# Patient Record
Sex: Female | Born: 1975 | Race: White | Hispanic: No | Marital: Married | Smoking: Never smoker
Health system: Southern US, Community
[De-identification: ages and names within clinical notes are randomized; demographics above are authoritative.]

## PROBLEM LIST (undated history)

## (undated) DIAGNOSIS — Z9289 Personal history of other medical treatment: Secondary | ICD-10-CM

## (undated) DIAGNOSIS — Z8619 Personal history of other infectious and parasitic diseases: Secondary | ICD-10-CM

## (undated) DIAGNOSIS — D638 Anemia in other chronic diseases classified elsewhere: Secondary | ICD-10-CM

## (undated) DIAGNOSIS — K5190 Ulcerative colitis, unspecified, without complications: Secondary | ICD-10-CM

## (undated) DIAGNOSIS — K51911 Ulcerative colitis, unspecified with rectal bleeding: Secondary | ICD-10-CM

## (undated) HISTORY — PX: OTHER SURGICAL HISTORY: SHX169

---

## 1996-12-20 HISTORY — PX: WISDOM TOOTH EXTRACTION: SHX21

## 2007-10-23 ENCOUNTER — Inpatient Hospital Stay (HOSPITAL_COMMUNITY): Admission: AD | Admit: 2007-10-23 | Discharge: 2007-10-23 | Payer: Self-pay | Admitting: Obstetrics and Gynecology

## 2007-10-24 ENCOUNTER — Inpatient Hospital Stay (HOSPITAL_COMMUNITY): Admission: AD | Admit: 2007-10-24 | Discharge: 2007-10-31 | Payer: Self-pay | Admitting: Obstetrics and Gynecology

## 2007-10-27 ENCOUNTER — Encounter (INDEPENDENT_AMBULATORY_CARE_PROVIDER_SITE_OTHER): Payer: Self-pay | Admitting: Gastroenterology

## 2007-10-30 ENCOUNTER — Ambulatory Visit: Payer: Self-pay | Admitting: Internal Medicine

## 2008-06-05 ENCOUNTER — Inpatient Hospital Stay (HOSPITAL_COMMUNITY): Admission: AD | Admit: 2008-06-05 | Discharge: 2008-06-08 | Payer: Self-pay | Admitting: Obstetrics and Gynecology

## 2008-08-15 ENCOUNTER — Ambulatory Visit (HOSPITAL_COMMUNITY): Admission: RE | Admit: 2008-08-15 | Discharge: 2008-08-15 | Payer: Self-pay | Admitting: Obstetrics and Gynecology

## 2008-12-20 HISTORY — PX: OTHER SURGICAL HISTORY: SHX169

## 2009-07-23 ENCOUNTER — Encounter (INDEPENDENT_AMBULATORY_CARE_PROVIDER_SITE_OTHER): Payer: Self-pay | Admitting: Otolaryngology

## 2009-07-23 ENCOUNTER — Ambulatory Visit (HOSPITAL_BASED_OUTPATIENT_CLINIC_OR_DEPARTMENT_OTHER): Admission: RE | Admit: 2009-07-23 | Discharge: 2009-07-23 | Payer: Self-pay | Admitting: Otolaryngology

## 2011-03-27 LAB — POCT HEMOGLOBIN-HEMACUE: Hemoglobin: 15.5 g/dL — ABNORMAL HIGH (ref 12.0–15.0)

## 2011-05-04 NOTE — Op Note (Signed)
Morgan [name], Morgan [name]                 ACCOUNT NO.:  [id number]   MEDICAL RECORD NO.:  [id number]          PATIENT TYPE:  AMB   LOCATION:  DSC                          FACILITY:  [facility]   PHYSICIAN:  Jefry H. 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   PROCEDURE:  Sistrunk procedure.   ANESTHESIA:  General endotracheal anesthesia was used.   COMPLICATIONS:  No complications.   BLOOD LOSS:  No blood loss.   SURGEON:  Jefry H. Pollyann Kennedy, MD   FINDINGS:  A superficial cystic mass at the midline of neck between the  level of the hyoid and thyroid cartilage with thinning of the skin  overlying filled with a mucoid material with extension down to the  midportion of the hyoid bone, but nothing beyond that.   HISTORY:  A 35 year old with a history of a small cyst in the anterior  neck that recently ruptured and drained mucoid material.  Risks,  benefits, alternatives, and complications of the procedure explained to  the patient, seemed to understand, and agreed to surgery.   DESCRIPTION OF PROCEDURE:  The patient was taken to the operating room  and placed on the operating table in the supine position.  Following  induction of general endotracheal anesthesia, the neck was prepped and  draped in standard fashion.  An ellipse of skin was outlined in a  transverse fashion overlying the cystic mass, and a 15 scalpel was used  to incise the skin.  Electrocautery was used to continue the dissection  through the subcutaneous tissue and the superficial fascia.  A small  skin flap was elevated superiorly and inferiorly and an Alm retractor  was used to keep the skin flaps open.  The cystic structure was  dissected from surrounding  tissue above the thyroid notch and down to  the level of the hyoid bone.  The midportion of the hyoid between the  lesser cornu bilaterally was transected using a bone cutter.  The  muscular attachments of the hyoid were transected with cautery and  inspection revealed no evidence of any extension of this beyond the  hyoid.  A core of suprahyoid musculature was taken approximately 1.5 cm  beyond the hyoid and taken with the specimen.  The specimen was sent for  pathologic evaluation.  The wound was irrigated with saline, and cautery  was used for completion of hemostasis.  Rubber band drain was left in  the  wound and exited through the central part of the incision.  The deep  tissue was reapproximated with 4-0 chromic suture and the subcuticular  layer was closed similarly.  A running 5-0 nylon was used on the skin.  A dressing was applied, and the patient was then awakened, extubated,  and transferred to recovery in stable condition.       Jefry  Massie Kluver, MD  Electronically Signed     JHR/MEDQ  D:  07/23/2009  T:  07/23/2009  Job:  [id number]

## 2011-05-04 NOTE — H&P (Signed)
NAMEMIKAYA, BUNNER NO.:  [id number]   MEDICAL RECORD NO.:  [id number]          PATIENT TYPE:  INP   LOCATION:  9167                          [facility]ACILITY:  [facility]   PHYSICIAN:  Lenoard Aden, M.D.DATE OF BIRTH:  [birth year]   DATE OF ADMISSION:  06/05/2008  DATE OF DISCHARGE:                              HISTORY & PHYSICAL   CHIEF COMPLAINT:  LGA, polyhydramnios.   She is a 35 year old female, G2, P1, apparently at 71 weeks' gestation  who presents for induction due to the aforementioned indications.   ALLERGIES:  ERYTHROMYCIN CAUSES AN UPSET STOMACH.   MEDICATIONS:  Include prenatal vitamins and sulfasalazine.   History of new onset C. diff colitis, questionable Crohn's disease,  hospitalized for 1 week during this pregnancy with GI followup.  History  of breast cysts.  History of hyperemesis.   FAMILY HISTORY:  COPD, hypertension, diabetes, kidney stones, colon  cancer and depression, OCD.   She has a personal history of a 41-week intrauterine pregnancy,  delivered, 9 pounds 9 ounces with mild shoulder dystocia.   PHYSICAL EXAMINATION:  GENERAL:  On physical exam she is a well-  developed, well-nourished female in no acute distress.   HEENT:  Normal.   LUNGS:  Clear.   HEART:  Regular rhythm.   ABDOMEN:  Soft, gravid, nontender.  Estimated fetal weight 8-1/2 to 9  pounds.   PELVIC:  Cervix is 2 cm, 50% vertex, -1, -2.   EXTREMITIES:  Negative.   NEUROLOGIC:  Nonfocal.   SKIN:  Intact.   Cervidil to be placed.  NST is reactive.   IMPRESSION:  1. A 39-week intrauterine pregnancy.  2. Large for gestational age with polyhydramnios.  3. History of large for gestational age with moderate shoulder      dystocia.   PLAN:  Proceed with cervical ripening.  Anticipate attempts at vaginal  delivery with Pitocin in a.m. and epidural as needed.      Lenoard Aden, M.D.  Electronically Signed     RJT/MEDQ  D:  06/05/2008  T:  06/05/2008   Job:  [id number]

## 2011-05-04 NOTE — H&P (Signed)
[name], Morgan [name] NO.:  [id number]   MEDICAL RECORD NO.:  [id number]          PATIENT TYPE:  AMB   LOCATION:  SDC                           [facility]   PHYSICIAN:  Lenoard Aden, M.D.DATE OF BIRTH:  1976-05-22   DATE OF ADMISSION:  08/15/2008  DATE OF DISCHARGE:                              HISTORY & PHYSICAL   CHIEF COMPLAINT:  Vaginal stenosis, scarring, and dyspareunia.   HISTORY OF PRESENT ILLNESS:  She is a 35 year old female G2, P2, with  history of vaginal scarring after her first delivery status post second  vaginal delivery with persistent vaginal stenosis status post  uncomplicated repair, and vulvodynia secondary dyspareunia and vulvar  pain before vaginoplasty, vulvar adhesions, and removal of scar tissue  today.   ALLERGIES:  She has allergies to ERYTHROMYCIN.   MEDICATIONS:  Prenatal vitamins and Micronor.   FAMILY HISTORY:  Hypertension, COPD, diabetes, kidney stones, colon  cancer, depression, OCD, previous pregnancy course uncomplicated.   SOCIAL HISTORY:  Otherwise, noncontributory.   PHYSICAL EXAMINATION:  GENERAL:  She is a well-developed, well-nourished  white female in no acute distress.  HEENT:  Normal.  LUNGS:  Clear.  HEART:  Regular rhythm.  ABDOMEN:  Soft and nontender.  PELVIC:  Reveals posterior fourchette scarring and stenosis of the  vaginal opening at the posterior aspect, which admits only one finger  entry and it is noncoital in its capacity.  EXTREMITIES:  There are no cords.  NEUROLOGIC:  Intact.   IMPRESSION:  Post-delivery vaginal scarring and stenosis.   PLAN:  Proceed with revision of vaginal scar tissue, vaginoplasty, and  perineorrhaphy.  Risks of anesthesia, infection, bleeding, injury to  abdominal organs, and need for further surgery were discussed.  The  patient acknowledges and wishes to proceed.     Lenoard Aden, M.D.  Electronically Signed    RJT/MEDQ  D:  08/15/2008  T:   08/16/2008  Job:  [id number]

## 2011-05-04 NOTE — Consult Note (Signed)
Morgan [name], Morgan [name] NO.:  [id number]   MEDICAL RECORD NO.:  [id number]          PATIENT TYPE:  MAT   LOCATION:  MATC                          [facility]ILITY[facility]   PHYSICIAN:  Lenoard Aden, M.D.DATE OF BIRTH:  1976-05-09   DATE OF CONSULTATION:  DATE OF DISCHARGE:  10/23/2007                                 CONSULTATION   CHIEF COMPLAINT:  Nausea, vomiting, diarrhea.   She is a 35 year old female G2, P1, at [redacted] weeks gestational age who  presents with nausea, vomiting and now diarrhea for 3 weeks.  She has no  known drug allergies.   MEDICATIONS:  Zofran and prenatal vitamins as tolerated.   FAMILY HISTORY:  Diabetes, hypertension, otherwise noncontributory.   SOCIAL HISTORY:  Nonsmoker, nondrinker, denies domestic physical  violence, otherwise noncontributory.   History of uncomplicated vaginal delivery x1.   PHYSICAL EXAMINATION:  VITAL SIGNS:  Blood pressure 114/68, weight of  155 pounds.  Labs are consistent with a urine which is noted to be  positive for small ketones.  HEENT:  Normal.  LUNGS:  Clear.  HEART:  Regular rhythm.  ABDOMEN:  Soft, nontender, no rebound, no guarding, no CVA tenderness.  EXTREMITIES:  There are no cords.  NEUROLOGICAL:  Exam is nonfocal.  PELVIC:  Deferred.  SKIN:  Intact.   IMPRESSION:  A 7-week obstetrical patient with nausea, vomiting,  hyperemesis and possible viral gastroenteritis.   PLAN:  We will proceed with IV fluid hydration, Zofran IV, supportive  therapy.  Continue Imodium.  Stool for O and P, blood and leukocytes.  We will follow up and probably discharge, status post IV fluid  hydration.      Lenoard Aden, M.D.  Electronically Signed     RJT/MEDQ  D:  10/23/2007  T:  10/24/2007  Job:  [id number]

## 2011-05-04 NOTE — Op Note (Signed)
NAMEKATARZYNA, Morgan [name]                 ACCOUNT NO.:  [id number]   MEDICAL RECORD NO.:  [id number]          PATIENT TYPE:  AMB   LOCATION:  SDC                           [facility]   PHYSICIAN:  Lenoard Aden, M.D.DATE OF BIRTH:  03-Aug-1976   DATE OF PROCEDURE:  08/15/2008  DATE OF DISCHARGE:                               OPERATIVE REPORT   PREOPERATIVE DIAGNOSES:  Vaginal stenosis, dyspareunia, perineal  scarring, status post vaginal delivery, and vulvodynia.   POSTOPERATIVE DIAGNOSES:  Vaginal stenosis, dyspareunia, perineal  scarring, status post vaginal delivery, and vulvodynia.   PROCEDURES:  Repair of vaginal stenosis, vaginoplasty, and  perineorrhaphy.   SURGEON:  Lenoard Aden, MD.   ANESTHESIA:  Spinal and local.   ESTIMATED BLOOD LOSS:  Less than 50 mL.   COMPLICATIONS:  None.   DRAINS:  None.   COUNTS:  Correct.   The patient to recovery in good condition.   DESCRIPTION OF PROCEDURE:  After being apprised of the risks of  anesthesia, infection, bleeding, possible injury from bowel and bladder,  need for repair, and inability to cure perineal pain, the patient was  brought to the operating room where she was administered a spinal  anesthetic without complications.  She was prepped and draped in the  usual sterile fashion and catheterized until the bladder was empty.  Visualization reveals significant vaginal stenosis and scarring, status  post vaginal delivery with the vagina unable to admit one finger with a  large amount of posterior coaptation of the vaginal mucosa.  This area,  which was thickened, was incised in a vertical fashion using a scalpel  down into the perineal end of the posterior fourchette and the vagina  was undermined anteriorly using sharp dissection with Metzenbaum  scissors.  Good hemostasis was achieved using electrocautery on a  subcutaneous tissue.  Multiple subcutaneous sutures were placed to  approximate dead space using a 3-0  Vicryl suture on an SH needle and at  this time, mattress sutures were placed closing the vagina front to  back.  Multiple interrupted sutures were placed.  Good hemostasis was  achieved and multiple interrupted sutures were placed once again to  reapproximate  the skin approximating the perineorrhaphy repair in a standard fashion  using the 3-0 Vicryl.  Good hemostasis was noted.  A dilute Marcaine  solution 10 mL was placed at the termination of the case.  The patient  was awakened and transferred to recovery in good condition.      Lenoard Aden, M.D.  Electronically Signed     RJT/MEDQ  D:  08/15/2008  T:  08/16/2008  Job:  [id number]

## 2011-05-04 NOTE — H&P (Signed)
NAMEHal [name] NO.:  [id number]   MEDICAL RECORD NO.:  [id number]         PATIENT TYPE:  INP   LOCATION:                                [facility]   PHYSICIAN:  Lenoard Aden, M.D.DATE OF BIRTH:  [birth year]   DATE OF ADMISSION:  10/24/2007  DATE OF DISCHARGE:                              HISTORY & PHYSICAL   HOSPITAL COURSE:  Abdominal pain and bloody diarrhea.   She is a 35 year old female G2, P1, [redacted] weeks gestation with persistent 3-  week history of bloody diarrhea, intermittent nausea and vomiting.  She  is seen by GI today, Dr. Loreta Ave who has made the decision to proceed with  further workup in the hospital and henceforth, the patient was admitted  today.   ALLERGIES:  She has allergy to ERYTHROMYCIN.   MEDICATIONS:  1. Zofran.  2. Prenatal vitamins.   FAMILY HISTORY:  Diabetes, hypertension.   SOCIAL HISTORY:  Non-smoker, non-drinker.   __________ History of 1 uncomplicated vaginal delivery.   PHYSICAL EXAMINATION:  VITAL SIGNS:  Blood pressure 114/68, weight 155  lb.  HEENT:  Normal.  LUNGS:  Clear.  HEART:  Regular rhythm.  ABDOMEN:  Soft.  No rebound, no guarding.  Good bowel sounds noted.  PELVIC:  __________, 7-8 weeks size uterus with no adnexal masses.  EXTREMITIES:  __________.  NEUROLOGIC:  Nonfocal.  SKIN:  Intact.   IMPRESSION:  1. A 7-week OB.  2. Persistent nausea and vomiting, diarrhea.  Questionable viral      gastritis versus other causes of infectious diarrhea.   PLAN:  Will follow with GI, IV fluids, Zofran, antibiotics as needed.  Anticipate inpatient hospitalization, diagnostic testing.  Probable  discharge, possibly, 48-72 hours.      Lenoard Aden, M.D.  Electronically Signed     RJT/MEDQ  D:  10/24/2007  T:  10/25/2007  Job:  [id number]

## 2011-05-07 NOTE — Discharge Summary (Signed)
NAMEGENTRY, SEEBER                 ACCOUNT NO.:  [id number]   MEDICAL RECORD NO.:  [id number]          PATIENT TYPE:  INP   LOCATION:  9302                          [facility]   PHYSICIAN:  Lenoard Aden, M.D.DATE OF BIRTH:  1976/08/04   DATE OF ADMISSION:  10/24/2007  DATE OF DISCHARGE:  10/31/2007                               DISCHARGE SUMMARY   The patient was admitted for persistent diarrhea and abdominal pain with  secondary dehydration and first trimester pregnancy.  She had a  longstanding history of diarrhea, the etiology was uncertain.  She was  therefore admitted for IV fluid, antibiotics and stool specimen follow-  up.  She is followed by GI during her hospitalization and remained  stable.  She had multiple cultures done, serial labs and C Dif toxin  which came back positive.  She was treated appropriately with  antibiotics.  She was transported to [facility] on October 27, 2007 for  a colonoscopy which also was unrevealing.  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  She will follow-up with  GI within 1 week.  Follow up in the OB/GYN office within 1 week.  Discharge teaching was done.      Lenoard Aden, M.D.  Electronically Signed     RJT/MEDQ  D:  11/29/2007  T:  11/29/2007  Job:  [id number]

## 2011-09-16 LAB — CBC
HCT: 34.6 — ABNORMAL LOW
HCT: 35.2 — ABNORMAL LOW
Hemoglobin: 12.2
Hemoglobin: 12.3
MCHC: 35.3
MCV: 91.7
RDW: 13.2
WBC: 8.3

## 2011-09-28 LAB — BASIC METABOLIC PANEL
BUN: 1 — ABNORMAL LOW
BUN: 2 — ABNORMAL LOW
BUN: <1 — ABNORMAL LOW
BUN: <1 — ABNORMAL LOW
BUN: <1 — ABNORMAL LOW
CO2: 25
CO2: 28
Calcium: 7.7 — ABNORMAL LOW
Calcium: 7.8 — ABNORMAL LOW
Calcium: 7.8 — ABNORMAL LOW
Chloride: 100
Chloride: 105
Chloride: 95 — ABNORMAL LOW
Creatinine, Ser: 0.45
Creatinine, Ser: 0.53
Creatinine, Ser: 0.58
GFR calc Af Amer: >60
GFR calc non Af Amer: >60
Glucose, Bld: 104 — ABNORMAL HIGH
Glucose, Bld: 110 — ABNORMAL HIGH
Glucose, Bld: 114 — ABNORMAL HIGH
Potassium: 3.4 — ABNORMAL LOW
Potassium: 3.9
Sodium: 130 — ABNORMAL LOW
Sodium: 134 — ABNORMAL LOW

## 2011-09-28 LAB — CLOSTRIDIUM DIFFICILE EIA
C difficile Toxins A+B, EIA: NEGATIVE
C difficile Toxins A+B, EIA: NEGATIVE

## 2011-09-28 LAB — CBC
HCT: 32.4 — ABNORMAL LOW
HCT: 33.9 — ABNORMAL LOW
HCT: 35.3 — ABNORMAL LOW
Hemoglobin: 11.4 — ABNORMAL LOW
MCHC: 34.2
MCV: 85.8
MCV: 85.9
MCV: 86.4
MCV: 86.4
Platelets: 495 — ABNORMAL HIGH
RBC: 3.82 — ABNORMAL LOW
RDW: 12.9
WBC: 8
WBC: 8.1
WBC: 8.1

## 2011-09-28 LAB — HEPATIC FUNCTION PANEL
ALT: 18
Albumin: 2.2 — ABNORMAL LOW
Alkaline Phosphatase: 75
Bilirubin, Direct: 0.2
Indirect Bilirubin: 0.8
Total Bilirubin: 1

## 2011-09-28 LAB — URINALYSIS, ROUTINE W REFLEX MICROSCOPIC
Bilirubin Urine: NEGATIVE
Nitrite: NEGATIVE
Urobilinogen, UA: 0.2
pH: 7.5

## 2011-09-28 LAB — STOOL CULTURE

## 2011-09-28 LAB — DIFFERENTIAL
Basophils Absolute: 0
Basophils Relative: 0
Neutrophils Relative %: 50

## 2011-09-28 LAB — OVA AND PARASITE EXAMINATION

## 2012-10-01 ENCOUNTER — Emergency Department (HOSPITAL_COMMUNITY): Payer: Commercial Indemnity

## 2012-10-01 ENCOUNTER — Encounter (HOSPITAL_COMMUNITY): Payer: Self-pay | Admitting: *Deleted

## 2012-10-01 ENCOUNTER — Emergency Department (HOSPITAL_COMMUNITY)
Admission: EM | Admit: 2012-10-01 | Discharge: 2012-10-01 | Disposition: A | Discharge status: Home Health | Payer: Commercial Indemnity | Attending: Emergency Medicine | Admitting: Emergency Medicine

## 2012-10-01 DIAGNOSIS — T887XXA Unspecified adverse effect of drug or medicament, initial encounter: Secondary | ICD-10-CM

## 2012-10-01 DIAGNOSIS — R519 Headache, unspecified: Secondary | ICD-10-CM

## 2012-10-01 DIAGNOSIS — R000 Tachycardia, unspecified: Secondary | ICD-10-CM | POA: Insufficient documentation

## 2012-10-01 DIAGNOSIS — R51 Headache: Secondary | ICD-10-CM | POA: Insufficient documentation

## 2012-10-01 DIAGNOSIS — K625 Hemorrhage of anus and rectum: Secondary | ICD-10-CM | POA: Insufficient documentation

## 2012-10-01 LAB — URINALYSIS, ROUTINE W REFLEX MICROSCOPIC
Glucose, UA: NEGATIVE mg/dL
Leukocytes, UA: NEGATIVE
Nitrite: NEGATIVE
Protein, ur: NEGATIVE mg/dL
Urobilinogen, UA: 0.2 mg/dL (ref 0.0–1.0)

## 2012-10-01 LAB — CBC WITH DIFFERENTIAL/PLATELET
Basophils Absolute: 0 10*3/uL (ref 0.0–0.1)
Eosinophils Relative: 1 % (ref 0–5)
Lymphocytes Relative: 12 % (ref 12–46)
MCV: 82.2 fL (ref 78.0–100.0)
Neutro Abs: 9.7 10*3/uL — ABNORMAL HIGH (ref 1.7–7.7)
Platelets: 203 10*3/uL (ref 150–400)
RDW: 16.3 % — ABNORMAL HIGH (ref 11.5–15.5)
WBC: 11.7 10*3/uL — ABNORMAL HIGH (ref 4.0–10.5)

## 2012-10-01 LAB — BASIC METABOLIC PANEL
CO2: 28 mEq/L (ref 19–32)
Calcium: 8.6 mg/dL (ref 8.4–10.5)
GFR calc Af Amer: >90 mL/min (ref >90)
Sodium: 136 mEq/L (ref 135–145)

## 2012-10-01 LAB — LACTIC ACID, PLASMA: Lactic Acid, Venous: 1.3 mmol/L (ref 0.5–2.2)

## 2012-10-01 MED ORDER — METOCLOPRAMIDE HCL 5 MG/ML IJ SOLN
10.0000 mg | Freq: Once | INTRAMUSCULAR | Status: AC
  Administered 2012-10-01: 10 mg via INTRAVENOUS
  Filled 2012-10-01: qty 2

## 2012-10-01 MED ORDER — SODIUM CHLORIDE 0.9 % IV BOLUS (SEPSIS)
2000.0000 mL | Freq: Once | INTRAVENOUS | Status: AC
  Administered 2012-10-01: 2000 mL via INTRAVENOUS

## 2012-10-01 MED ORDER — KETOROLAC TROMETHAMINE 30 MG/ML IJ SOLN
30.0000 mg | Freq: Once | INTRAMUSCULAR | Status: AC
  Administered 2012-10-01: 30 mg via INTRAVENOUS
  Filled 2012-10-01: qty 1

## 2012-10-01 MED ORDER — HYDROCODONE-ACETAMINOPHEN 5-325 MG PO TABS: 1.0000 | ORAL_TABLET | Freq: Four times a day (QID) | ORAL | Status: DC | PRN

## 2012-10-01 MED ORDER — MORPHINE SULFATE 4 MG/ML IJ SOLN
4.0000 mg | Freq: Once | INTRAMUSCULAR | Status: AC
  Administered 2012-10-01: 4 mg via INTRAVENOUS
  Filled 2012-10-01: qty 1

## 2012-10-01 MED ORDER — IBUPROFEN 600 MG PO TABS: 600.0000 mg | ORAL_TABLET | Freq: Four times a day (QID) | ORAL | Status: DC | PRN

## 2012-10-01 NOTE — ED Notes (Signed)
Pt resting quietly at the time. States she feels a little better. 5/10 headache. She is conscious alert and oriented x4.

## 2012-10-01 NOTE — ED Provider Notes (Addendum)
History   This chart was scribed for Derwood Kaplan, MD by Charolett Bumpers . The patient was seen in room TR04C/TR04C. Patient's care was started at 1402.   CSN: [id number] Arrival date & time 10/01/12  1335  First MD Initiated Contact with Patient 10/01/12 1402      Chief Complaint  Patient presents with  . Facial Pain   HPI Comments: Morgan [name] is a 36 y.o. female who presents to the Emergency Department complaining of constant, severe facial pain that radiates to the back on her head for the past 4 days. She describes her pain as pressure, dull and achy at times while sharp and intense at other times. She reports associated white drainage from her sinuses. She states that she has taken Sudafed, ibuprofen, tylenol with no relief.  Mildly relieved with clearing her nose of congestion. No photophobia or visual disturbances but some sensitivity to sound. She denies any n/v, current fever, chills. She was d/c 4 days ago from hospital diagnosed with c-dif and ulcerative colitis in which she is taking antibiotics. She states that she had a mild headache at that time. She states her symptoms have been gradually worsening. She having oral lesions that have since resolved.     Patient is a 36 y.o. female presenting with headaches. The history is provided by the patient. No language interpreter was used.  Headache  This is a new problem. The current episode started more than 2 days ago. The problem occurs constantly. The problem has been gradually worsening. The quality of the pain is described as sharp and dull. Pertinent negatives include no fever, no shortness of breath, no nausea and no vomiting.      Past Medical History  Diagnosis Date  . Ulcerative colitis   . Clostridium difficile infection     History reviewed. No pertinent past surgical history.  No family history on file.  History  Substance Use Topics  . Smoking status: Never Smoker   . Smokeless tobacco: Not on file    . Alcohol Use: Yes     occ    OB History    Grav Para Term Preterm Abortions TAB SAB Ect Mult Living                  Review of Systems  Constitutional: Negative for fever and chills.  HENT: Positive for rhinorrhea.        Facial pain.   Eyes: Negative for photophobia and visual disturbance.  Respiratory: Negative for shortness of breath.   Cardiovascular: Negative for chest pain.  Gastrointestinal: Positive for anal bleeding. Negative for nausea, vomiting and abdominal pain.  Genitourinary: Negative for dysuria.  Neurological: Positive for headaches. Negative for weakness.  Hematological: Does not bruise/bleed easily.  Psychiatric/Behavioral: Negative for confusion.  All other systems reviewed and are negative.    Allergies  Review of patient's allergies indicates no known allergies.  Home Medications   Current Outpatient Rx  Name Route Sig Dispense Refill  . ACETAMINOPHEN 500 MG PO TABS Oral Take 1,000 mg by mouth every 6 (six) hours as needed. For pain    . BUDESONIDE 9 MG PO TB24 Oral Take 1 tablet by mouth daily.    . IBUPROFEN 200 MG PO TABS Oral Take 800 mg by mouth every 6 (six) hours as needed. For pain    . MESALAMINE 1.2 G PO TBEC Oral Take 1,200 mg by mouth 4 (four) times daily.    . TOBRAMYCIN-DEXAMETHASONE 0.3-0.1 % OP OINT  Left Eye Place 1 application into the left eye 3 (three) times daily.    Marland Kitchen VANCOMYCIN HCL 125 MG PO CAPS Oral Take 125 mg by mouth 4 (four) times daily.      BP 140/102  Pulse 122  Temp 98.6 F (37 C) (Oral)  Resp 22  SpO2 100%  Physical Exam  Nursing note and vitals reviewed. Constitutional: She is oriented to person, place, and time. She appears well-developed and well-nourished. No distress.  HENT:  Head: Normocephalic and atraumatic.       No crepitus on facial muscles but tenderness noted.   Eyes: EOM are normal. Pupils are equal, round, and reactive to light.       3 mm pupils equal, round and reactive to light. EOM  intact.   Neck: Normal range of motion. Neck supple. No tracheal deviation present.       No nuchal rigidity.   Cardiovascular: Regular rhythm and normal heart sounds.  Tachycardia present.   No murmur heard. Pulmonary/Chest: Effort normal and breath sounds normal. No respiratory distress. She has no wheezes. She has no rhonchi. She has no rales.       Lungs clear to auscultation bilaterally.   Abdominal: Soft. There is no tenderness.  Musculoskeletal: Normal range of motion. She exhibits no tenderness.  Lymphadenopathy:       Head (right side): No preauricular and no posterior auricular adenopathy present.       Head (left side): No preauricular and no posterior auricular adenopathy present.    She has no cervical adenopathy.  Neurological: She is alert and oriented to person, place, and time. No cranial nerve deficit. Coordination normal. She displays no Babinski's sign on the right side. She displays no Babinski's sign on the left side.       Negative Kernig's sign. Neuro exam benign.   Skin: Skin is warm and dry.  Psychiatric: She has a normal mood and affect. Her behavior is normal.    ED Course  Procedures (including critical care time)  DIAGNOSTIC STUDIES: Oxygen Saturation is 100% on room air, normal by my interpretation.    COORDINATION OF CARE:  14:20-Discussed planned course of treatment with the patient including a head CT scan, IV fluids, UA, blood work and EKG, who is agreeable at this time.   14:30-Medication Orders: Sodium chloride 0.9% bolus 2,000 mL-once   Labs Reviewed - No data to display No results found.   No diagnosis found.    MDM  Medical screening examination/treatment/procedure(s) were performed by me as the supervising physician. Scribe service was utilized for documentation only.  Pt comes in with cc of facial pain and headaches. She has hx of UC, and is on medications for that (uceris  -systemic steroid and another immunomodulator). She is  also on vancomycin for cdiff.  DDX includes: Primary headaches - including migrainous headaches, cluster headaches, tension headaches. ICH Abscess Cavernous sinus thrombosis Meningitis Sinusitis  There are no meningeal signs, and no neuro deficits - making meningeal process, ICH less likely. No fevers, no AMS, making and abscess less likely. Facial pains with some congestion - does heighten suspicion for sinusitis, but truly, her pain is out of proportion for sinus type headaches. We considered Cavernous sinus thrombosis as well - but again, exam shows no opthalmoplegia, visual deficits, eye droop etc. That are seen with cavernous sinus thrombosis. Finally - she was recently started on Uceris - and uptodate, micromedex reveal that 21% of people will have headaches and that one  of the adverse reaction is elevated intracranial pressure. Our patient has no nausea, no visual complains - just gradually worsening headaches - and so my suspicion is that this is likely a medication side effect. I had also noted that during the exam, when i had her lay in supine position, her headaches were worse - again pointing to elevated ICP.  Pt is tachycardic, will hydrate and check basic labs.      Date: 10/01/2012  Rate: 116  Rhythm: sinus tachycardia  QRS Axis: normal  Intervals: normal  ST/T Wave abnormalities: normal  Conduction Disutrbances: none  Narrative Interpretation: unremarkable     Derwood Kaplan, MD 10/01/12 1451  Derwood Kaplan, MD 10/01/12 1515  I spoke with Dr. Steele Sizer, patient's GI doctor at [phone] [id number] We primarily discussed 2 issues, headaches and her Anemia.  For the headaches -my concerns are that patient has medication related side effects, and may be elevated ICP. In Dr. Lewie Loron experience, he hasn't seen any elevated ICP with this medication. He would like [place] to defer the decision to tap to Neurology, as elevated icp will lead to discontinuing the medication,  which is working really well for the patient.  The other concern was Hb. Patient's Hb at discharge was 8.1 at [facility]. It is 7.9 today. She reports always being tachycardic, and states that her goal Hb was set to 8 by her Doctors - and Dr. Steele Sizer agrees to that, although he is not 100% sure what the Hb was at discharge, he is not surprised to hear that it was in the 8 range. She has tachycardia, but could be due to pain as well, she is asymptomatic otherwise and reports significant improvement in her bleeding - so we will not transfuse this healthy woman otherwise. Will have her CBC rechecked in 1 week by pcp and ask her to return to the ER if the sx get worse.  Derwood Kaplan, MD 10/01/12 1607  Neurology, Dr. Thad [name] had a discussion with the GI doctor as well - and the plan is to NOT get a LP, to STOP the suspected steroid medication and have her see Neurology and GI doctor. No taper of steroid meds per GI.  Derwood Kaplan, MD 10/01/12 1810

## 2012-10-01 NOTE — ED Notes (Signed)
Pt transported to xray 

## 2012-10-01 NOTE — Consult Note (Signed)
Reason for Consult:Headache  Referring Physician: Rhunette Croft  CC: Headache  HPI: Morgan [name] is an 36 y.o. female who reports that recently she has been diagnosed with C. Diff.  It did not respond well to the initial antibiotics and the patient was admitted last week.  It was felt at that time that she had a flare of her ulcerative colitis as well and was started on a ne medication-Uceris.  Since its start she has experienced slowly worsening headaches.  She describes the headache as starting over her nose and radiating under her eyes and then around her eyes.  It became so severe today that it radiated to the top of her head and to her neck in the back.  The headache is squeezing.  Initially it was controlled well with prn dosing of OTC medications.  She then attempted Sudafed when they did not work but now nothing over the counter is working.  She presented to the ED today because she could no longer take it.  She has responded well to Morphine and now has no headache.  She has had no associated nausea, vomiting, photophobia, phonophobia or visual changes.  Headache is owrse with lying down and better when sitting or standing to the point that she has been sleeping propped up  Past Medical History  Diagnosis Date  . Ulcerative colitis   . Clostridium difficile infection     History reviewed. No pertinent past surgical history.  No family history on file.  Social History:  reports that she has never smoked. She does not have any smokeless tobacco history on file. She reports that she drinks alcohol. She reports that she does not use illicit drugs.  No Known Allergies  Medications: I have reviewed the patient's current medications. Prior to Admission:  Current outpatient prescriptions:acetaminophen (TYLENOL) 500 MG tablet, Take 1,000 mg by mouth every 6 (six) hours as needed. For pain, Disp: , Rfl: ;  Budesonide (UCERIS) 9 MG TB24, Take 1 tablet by mouth daily., Disp: , Rfl: ;  ibuprofen  (ADVIL,MOTRIN) 200 MG tablet, Take 800 mg by mouth every 6 (six) hours as needed. For pain, Disp: , Rfl: ;  mesalamine (LIALDA) 1.2 G EC tablet, Take 1,200 mg by mouth 4 (four) times daily., Disp: , Rfl:  tobramycin-dexamethasone (TOBRADEX) ophthalmic ointment, Place 1 application into the left eye 3 (three) times daily., Disp: , Rfl: ;  vancomycin (VANCOCIN) 125 MG capsule, Take 125 mg by mouth 4 (four) times daily., Disp: , Rfl:   ROS: History obtained from the patient  General ROS: negative for - chills, fatigue, fever, night sweats, weight gain or weight loss Psychological ROS: negative for - behavioral disorder, hallucinations, memory difficulties, mood swings or suicidal ideation Ophthalmic ROS: negative for - blurry vision, double vision, eye pain or loss of vision ENT ROS: negative for - epistaxis, nasal discharge, oral lesions, sore throat, tinnitus or vertigo Allergy and Immunology ROS: negative for - hives or itchy/watery eyes Hematological and Lymphatic ROS: negative for - bleeding problems, bruising or swollen lymph nodes Endocrine ROS: negative for - galactorrhea, hair pattern changes, polydipsia/polyuria or temperature intolerance Respiratory ROS: negative for - cough, hemoptysis, shortness of breath or wheezing Cardiovascular ROS: negative for - chest pain, dyspnea on exertion, edema or irregular heartbeat Gastrointestinal ROS: as noted in HPI Genito-Urinary ROS: negative for - dysuria, hematuria, incontinence or urinary frequency/urgency Musculoskeletal ROS: negative for - joint swelling or muscular weakness Neurological ROS: as noted in HPI Dermatological ROS: negative for rash and  skin lesion changes  Physical Examination: Blood pressure 140/102, pulse 122, temperature 98.6 F (37 C), temperature source Oral, resp. rate 22, last menstrual period 10/01/2012, SpO2 100.00%.  Neurologic Examination Mental Status: Alert, oriented, thought content appropriate.  Speech fluent  without evidence of aphasia.  Able to follow 3 step commands without difficulty. Cranial Nerves: II: Discs flat bilaterally; Visual fields grossly normal, pupils equal, round, reactive to light and accommodation III,IV, VI: ptosis not present, extra-ocular motions intact bilaterally with end gaze nystagmus bilateraelly V,VII: smile symmetric, facial light touch sensation normal bilaterally VIII: hearing normal bilaterally IX,X: gag reflex present XI: bilateral shoulder shrug XII: midline tongue extension Motor: Right : Upper extremity   5/5    Left:     Upper extremity   5/5  Lower extremity   5/5     Lower extremity   5/5 Tone and bulk:normal tone throughout; no atrophy noted Sensory: Pinprick and light touch intact throughout, bilaterally.  Decreased vibratory sensation in the toes blaterally Deep Tendon Reflexes: 2+ and symmetric throughout Plantars: Right: downgoing   Left: downgoing Cerebellar: normal finger-to-nose, normal rapid alternating movements and normal heel-to-shin test CV: pulses palpable throughout   Laboratory Studies:   Basic Metabolic Panel:  Lab 10/01/12 1610  NA 136  K 3.8  CL 100  CO2 28  GLUCOSE 120*  BUN 4*  CREATININE 0.47*  CALCIUM 8.6  MG --  PHOS --    CBC:  Lab 10/01/12 1431  WBC 11.7*  NEUTROABS 9.7*  HGB 7.9*  HCT 25.4*  MCV 82.2  PLT 203    Cardiac Enzymes: No results found for this basename: CKTOTAL:5,CKMB:5,CKMBINDEX:5,TROPONINI:5 in the last 168 hours  BNP: No components found with this basename: POCBNP:5  CBG: No results found for this basename: GLUCAP:5 in the last 168 hours  Microbiology: Results for orders placed during the hospital encounter of 10/24/07  STOOL CULTURE     Status: Normal   Collection Time   10/24/07  6:00 PM      Component Value Range Status Comment   Specimen Description STOOL   Final    Special Requests  IMMUNE:NORM   Final    Culture     Final    Value: NO SALMONELLA, SHIGELLA, CAMPYLOBACTER,  OR YERSINIA ISOLATED   Report Status 10/28/2007 FINAL   Final   CLOSTRIDIUM DIFFICILE EIA     Status: Normal   Collection Time   10/24/07  6:00 PM      Component Value Range Status Comment   Specimen Description STOOL   Final    Special Requests  IMMUNE:NORM   Final    C difficile Toxins A+B, EIA NEGATIVE   Final    Report Status 10/25/2007 FINAL   Final   CLOSTRIDIUM DIFFICILE EIA     Status: Normal   Collection Time   10/28/07  6:19 PM      Component Value Range Status Comment   Specimen Description STOOL   Final    Special Requests  Vancomycin  IMMUNE:NORM   Final    C difficile Toxins A+B, EIA     Final    Value: POSITIVE Initiate contact isolation if patient is in a healthcare setting.   Report Status 10/29/2007 FINAL   Final   CLOSTRIDIUM DIFFICILE EIA     Status: Normal   Collection Time   10/29/07  8:45 PM      Component Value Range Status Comment   Specimen Description STOOL   Final  Special Requests     Final    Value:  bloody diarrhea  Vancomycin,Azithromycin  IMMUNE:NORM   C difficile Toxins A+B, EIA NEGATIVE   Final    Report Status 10/31/2007 FINAL   Final     Coagulation Studies:  Basename 10/01/12 1431  LABPROT 13.1  INR 1.00    Urinalysis:  Lab 10/01/12 1558  COLORURINE YELLOW  LABSPEC 1.007  PHURINE 7.0  GLUCOSEU NEGATIVE  HGBUR SMALL*  BILIRUBINUR NEGATIVE  KETONESUR NEGATIVE  PROTEINUR NEGATIVE  UROBILINOGEN 0.2  NITRITE NEGATIVE  LEUKOCYTESUR NEGATIVE    Lipid Panel:  No results found for this basename: chol, trig, hdl, cholhdl, vldl, ldlcalc    HgbA1C:  No results found for this basename: HGBA1C    Urine Drug Screen:   No results found for this basename: labopia, cocainscrnur, labbenz, amphetmu, thcu, labbarb    Alcohol Level: No results found for this basename: ETH:2 in the last 168 hours   Imaging: Ct Head Wo Contrast  10/01/2012  *RADIOLOGY REPORT*  Clinical Data: Headache and facial pain.  On immunosuppressants.  CT HEAD  WITHOUT CONTRAST  Technique:  Contiguous axial images were obtained from the base of the skull through the vertex without contrast.  Comparison: None.  Findings: There is no evidence of intracranial hemorrhage, brain edema or other signs of acute infarction.  There is no evidence of intracranial mass lesion or mass effect.  No abnormal extra-axial fluid collections are identified.  Ventricles are normal in size.  No skull abnormality identified. Mucosal thickening is seen involving the visualized portions of the ethmoid and sphenoid sinuses bilaterally.  IMPRESSION:  1.  No evidence of intracranial abnormality. 2.  Bilateral ethmoid and sphenoid sinus disease.   Original Report Authenticated By: Danae Orleans, M.D.      Assessment/Plan: 36 yo female presenting with headaches that have started since the initiation of Uceris.  .  She has no significant history of head.ches.  Although description of pain atypical for increased intracranial pressure, the patient does have some characteristics regarding position that may be suggestive.  Patient is asymptomatic otherwise though other than headache and has no visual complaints and no disc blurring on exam.  Based on timing though, am concerned that the headche may be secondary to Uceris.  Have discussed the patient with Dr. Steele Sizer (her GI doc) and a decision was made to give her a trial off Uceris.  CT shows no acute intracranial issues but her sinuses do seem involved and may require investigation as well.    Recommendations: 1.  D/C Uceris 2.  No LP indicated at this time 3.  Patient to be given some medication for pain to use short term as the medication is getting out of her system since she may have some headaches during that period of time.  4.  Patient to have evaluation for her sinus disease as an outpatient.   5.  May schedule an appointment for follow up with neurology as an outpatient and cancel if her symptoms resolve ([phone])       Thana Farr, MD [facility] [phone] 10/01/2012, 5:42 PM

## 2012-10-01 NOTE — ED Notes (Signed)
Pt discharged from [facility] one week ago with c-diff and ulcerative colitis and started having headaches in the hospital.  Pt now with facial pain and states it goes to top of head.  Pt upset and crying

## 2012-10-01 NOTE — ED Notes (Signed)
Pt discharged to home with family. Patient voiced understanding of discharge instructions and prescriptions. NAD.

## 2013-12-03 ENCOUNTER — Encounter (HOSPITAL_COMMUNITY): Payer: Self-pay | Admitting: Emergency Medicine

## 2013-12-03 ENCOUNTER — Inpatient Hospital Stay (HOSPITAL_COMMUNITY): Payer: Managed Care, Other (non HMO)

## 2013-12-03 ENCOUNTER — Inpatient Hospital Stay (HOSPITAL_COMMUNITY)
Admission: EM | Admit: 2013-12-03 | Discharge: 2013-12-04 | DRG: 386 | Disposition: A | Discharge status: Home / Self Care | Payer: Managed Care, Other (non HMO) | Attending: Family Medicine | Admitting: Family Medicine

## 2013-12-03 DIAGNOSIS — K5180 Other ulcerative colitis without complications: Secondary | ICD-10-CM

## 2013-12-03 DIAGNOSIS — Z9289 Personal history of other medical treatment: Secondary | ICD-10-CM

## 2013-12-03 DIAGNOSIS — K922 Gastrointestinal hemorrhage, unspecified: Secondary | ICD-10-CM

## 2013-12-03 DIAGNOSIS — R739 Hyperglycemia, unspecified: Secondary | ICD-10-CM | POA: Diagnosis present

## 2013-12-03 DIAGNOSIS — E871 Hypo-osmolality and hyponatremia: Secondary | ICD-10-CM | POA: Diagnosis present

## 2013-12-03 DIAGNOSIS — Z79899 Other long term (current) drug therapy: Secondary | ICD-10-CM

## 2013-12-03 DIAGNOSIS — D649 Anemia, unspecified: Secondary | ICD-10-CM

## 2013-12-03 DIAGNOSIS — D638 Anemia in other chronic diseases classified elsewhere: Secondary | ICD-10-CM

## 2013-12-03 DIAGNOSIS — K51911 Ulcerative colitis, unspecified with rectal bleeding: Secondary | ICD-10-CM

## 2013-12-03 DIAGNOSIS — Z888 Allergy status to other drugs, medicaments and biological substances status: Secondary | ICD-10-CM

## 2013-12-03 DIAGNOSIS — Z8619 Personal history of other infectious and parasitic diseases: Secondary | ICD-10-CM

## 2013-12-03 DIAGNOSIS — K5190 Ulcerative colitis, unspecified, without complications: Principal | ICD-10-CM

## 2013-12-03 DIAGNOSIS — R7309 Other abnormal glucose: Secondary | ICD-10-CM | POA: Diagnosis present

## 2013-12-03 DIAGNOSIS — Z791 Long term (current) use of non-steroidal anti-inflammatories (NSAID): Secondary | ICD-10-CM

## 2013-12-03 DIAGNOSIS — E876 Hypokalemia: Secondary | ICD-10-CM | POA: Diagnosis present

## 2013-12-03 HISTORY — DX: Anemia in other chronic diseases classified elsewhere: D63.8

## 2013-12-03 HISTORY — DX: Ulcerative colitis, unspecified with rectal bleeding: K51.911

## 2013-12-03 HISTORY — DX: Personal history of other medical treatment: Z92.89

## 2013-12-03 HISTORY — DX: Personal history of other infectious and parasitic diseases: Z86.19

## 2013-12-03 LAB — CBC
MCH: 19.9 pg — ABNORMAL LOW (ref 26.0–34.0)
MCHC: 28.5 g/dL — ABNORMAL LOW (ref 30.0–36.0)
MCV: 69.9 fL — ABNORMAL LOW (ref 78.0–100.0)
Platelets: 410 10*3/uL — ABNORMAL HIGH (ref 150–400)
RBC: 2.56 MIL/uL — ABNORMAL LOW (ref 3.87–5.11)
RDW: 17.8 % — ABNORMAL HIGH (ref 11.5–15.5)
WBC: 8.4 10*3/uL (ref 4.0–10.5)

## 2013-12-03 LAB — BASIC METABOLIC PANEL
BUN: 10 mg/dL (ref 6–23)
Calcium: 8.8 mg/dL (ref 8.4–10.5)
Chloride: 95 mEq/L — ABNORMAL LOW (ref 96–112)
Creatinine, Ser: 0.82 mg/dL (ref 0.50–1.10)
GFR calc Af Amer: >90 mL/min (ref >90)
Sodium: 130 mEq/L — ABNORMAL LOW (ref 135–145)

## 2013-12-03 LAB — URINALYSIS, ROUTINE W REFLEX MICROSCOPIC
Bilirubin Urine: NEGATIVE
Hgb urine dipstick: NEGATIVE
Protein, ur: NEGATIVE mg/dL
Urobilinogen, UA: 0.2 mg/dL (ref 0.0–1.0)

## 2013-12-03 LAB — PREPARE RBC (CROSSMATCH)

## 2013-12-03 LAB — CG4 I-STAT (LACTIC ACID): Lactic Acid, Venous: 2.03 mmol/L (ref 0.5–2.2)

## 2013-12-03 LAB — PROTIME-INR
INR: 1.06 (ref 0.00–1.49)
Prothrombin Time: 13.6 seconds (ref 11.6–15.2)

## 2013-12-03 MED ORDER — POTASSIUM CHLORIDE CRYS ER 20 MEQ PO TBCR
40.0000 meq | EXTENDED_RELEASE_TABLET | Freq: Once | ORAL | Status: AC
  Administered 2013-12-03: 40 meq via ORAL
  Filled 2013-12-03: qty 2

## 2013-12-03 MED ORDER — ALBUTEROL SULFATE (5 MG/ML) 0.5% IN NEBU: 2.5000 mg | INHALATION_SOLUTION | RESPIRATORY_TRACT | Status: DC | PRN

## 2013-12-03 MED ORDER — ACETAMINOPHEN 650 MG RE SUPP: 650.0000 mg | Freq: Four times a day (QID) | RECTAL | Status: DC | PRN

## 2013-12-03 MED ORDER — SODIUM CHLORIDE 0.9 % IJ SOLN: 3.0000 mL | Freq: Two times a day (BID) | INTRAMUSCULAR | Status: DC

## 2013-12-03 MED ORDER — ONDANSETRON HCL 4 MG PO TABS: 4.0000 mg | ORAL_TABLET | Freq: Four times a day (QID) | ORAL | Status: DC | PRN

## 2013-12-03 MED ORDER — SODIUM CHLORIDE 0.9 % IV BOLUS (SEPSIS)
1000.0000 mL | Freq: Once | INTRAVENOUS | Status: AC
  Administered 2013-12-03: 1000 mL via INTRAVENOUS

## 2013-12-03 MED ORDER — MESALAMINE 1.2 G PO TBEC
1200.0000 mg | DELAYED_RELEASE_TABLET | Freq: Four times a day (QID) | ORAL | Status: DC
  Administered 2013-12-04 (×2): 1.2 g via ORAL
  Filled 2013-12-03 (×6): qty 1

## 2013-12-03 MED ORDER — ACETAMINOPHEN 325 MG PO TABS
650.0000 mg | ORAL_TABLET | Freq: Four times a day (QID) | ORAL | Status: DC | PRN
Start: 2013-12-03 — End: 2013-12-04

## 2013-12-03 MED ORDER — POTASSIUM CHLORIDE IN NACL 40-0.9 MEQ/L-% IV SOLN
INTRAVENOUS | Status: AC
  Filled 2013-12-03: qty 1000

## 2013-12-03 MED ORDER — ONDANSETRON HCL 4 MG/2ML IJ SOLN: 4.0000 mg | Freq: Four times a day (QID) | INTRAMUSCULAR | Status: DC | PRN

## 2013-12-03 MED ORDER — ZOLPIDEM TARTRATE 5 MG PO TABS: 5.0000 mg | ORAL_TABLET | Freq: Every evening | ORAL | Status: DC | PRN

## 2013-12-03 MED ORDER — INSULIN ASPART 100 UNIT/ML ~~LOC~~ SOLN: 0.0000 [IU] | Freq: Three times a day (TID) | SUBCUTANEOUS | Status: DC

## 2013-12-03 NOTE — H&P (Addendum)
[facility]  History and Physical  Morgan [name] WUJ:[id number] DOB: [birth year] DOA: 12/03/2013  Referring physician: EDP PCP: No primary provider on file.  Outpatient Specialists:  1. Gastroenterology: Dr. Tyler Aas, at [facility].  Chief Complaint: Diarrhea, rectal bleeding, dyspnea and palpitations on exertion.  HPI: Morgan [name] is a 37 y.o. 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 She states that at baseline she was having up to 6 diarrheal stools daily with associated dark discoloration which she attributed to iron supplements. She ran out of and supplements 7-10 days ago. Approximately a week ago, she started noticing worsening of diarrhea where she has up to 6 episodes in the day and 3 episodes at night with varying amount of bright red blood in the stools which can range from small clots, streaks tube blood tinging of the bowel water. She had 2 episodes of nonbloody emesis over the last 1 week. Appetite is borderline. She has lost 5 pounds of weight in the last 1 week. She also complains of intermittent fevers at night which have been going on for months-highest 101F. She denies abdominal pain. She was started on Humira a month ago and does not seem to have made any difference. She also says that she failed a course of steroids in July 2014. Over the last 2 weeks, she has noted progressively worsening generalized weakness and dyspnea on exertion. The symptoms got severe 2 days ago were going from one room to the other brought on dyspnea and palpitations. She denies chest pain. She has occasional lightheadedness but no episodes of passing out. She felt like  she feels when she has severe anemia. Blood tests with her OB/GYN doc showed hemoglobin of 5.5 and she presented to the ED. In the ED, sodium 1:30, potassium 3.3, glucose 164, hemoglobin 5.1. Hospitalist admission requested.   Review of Systems: All systems reviewed and apart from history of presenting illness, are negative. She gives history of intermittent cough with yellow sputum.  Past Medical History  Diagnosis Date  . Ulcerative colitis   . Clostridium difficile infection   . History of blood transfusion    Past Surgical History  Procedure Laterality Date  . Wisdom tooth extraction  1998  . Redo epistomy    . Thyro glossal duct cyst  2010   Social History:  reports that she has never smoked. She has never used smokeless tobacco. She reports that she drinks alcohol. She reports that she does not use illicit drugs. LMP 09/30/2013. Spouse had vasectomy.  Allergies  Allergen Reactions  . Budesonide Other (See Comments)    Brain swelling    History reviewed. No pertinent family history. negative family history.  Prior to Admission medications   Medication Sig Start Date End Date Taking? Authorizing Provider  acetaminophen (TYLENOL) 500 MG tablet Take 1,000 mg by mouth every 6 (six) hours as needed for moderate pain or fever.    Yes Historical Provider, MD  Adalimumab (HUMIRA PEN [place]) Inject 1 pen into the skin every 14 (fourteen) days.   Yes Historical Provider, MD  ibuprofen (ADVIL,MOTRIN) 200 MG tablet Take 800 mg by mouth every 6 (six) hours as needed for fever or moderate pain.    Yes Historical Provider, MD  mesalamine (LIALDA) 1.2  G EC tablet Take 1,200 mg by mouth 4 (four) times daily.   Yes Historical Provider, MD  zolpidem (AMBIEN) 10 MG tablet Take 5-10 mg by mouth at bedtime as needed for sleep.   Yes Historical Provider, MD   Physical Exam: Filed Vitals:   12/03/13 1709  BP: 123/73  Pulse: 117  Temp: 98.3 F (36.8 C)  TempSrc: Oral  Resp: 16  SpO2: 100%      General exam: Moderately built and thinly nourished female patient, lying comfortably supine on the gurney in no obvious distress. Does not look septic or toxic.  Head, eyes and ENT: Nontraumatic and normocephalic. Pupils equally reacting to light and accommodation. Oral mucosa mildly dry. Mucosa pale.  Neck: Supple. No JVD, carotid bruit or thyromegaly.  Lymphatics: No lymphadenopathy.  Respiratory system: Clear to auscultation. No increased work of breathing.  Cardiovascular system: S1 and S2 heard, regular and mildly tachycardic. No JVD, murmurs, gallops, clicks or pedal edema.  Gastrointestinal system: Abdomen is nondistended, soft and nontender. Normal bowel sounds heard. No organomegaly or masses appreciated.  Central nervous system: Alert and oriented. No focal neurological deficits.  Extremities: Symmetric 5 x 5 power. Peripheral pulses symmetrically felt.   Skin: No rashes or acute findings.  Musculoskeletal system: Negative exam.  Psychiatry: Pleasant and cooperative.   Labs on Admission:  Basic Metabolic Panel:  Recent Labs Lab 12/03/13 1919  NA 130*  K 3.3*  CL 95*  CO2 22  GLUCOSE 164*  BUN 10  CREATININE 0.82  CALCIUM 8.8   Liver Function Tests: No results found for this basename: AST, ALT, ALKPHOS, BILITOT, PROT, ALBUMIN,  in the last 168 hours No results found for this basename: LIPASE, AMYLASE,  in the last 168 hours No results found for this basename: AMMONIA,  in the last 168 hours CBC:  Recent Labs Lab 12/03/13 1919  WBC 8.4  HGB 5.1*  HCT 17.9*  MCV 69.9*  PLT 410*   Cardiac Enzymes: No results found for this basename: CKTOTAL, CKMB, CKMBINDEX, TROPONINI,  in the last 168 hours  BNP (last 3 results) No results found for this basename: PROBNP,  in the last 8760 hours CBG: No results found for this basename: GLUCAP,  in the last 168 hours  Radiological Exams on Admission: No results found.  Assessment/Plan Principal  Problem:   Ulcerative colitis Active Problems:   GI bleed   History of blood transfusion   Anemia   Hyponatremia   Hypokalemia   Hyperglycemia   H/O Clostridium difficile infection   Ulcerative colitis with rectal bleeding   Poorly controlled ulcerative colitis with rectal bleeding - Admit to telemetry for close monitoring and management. - GI consulted and will see patient on 12/16 and may consider flexible sigmoidoscopy - Continue regular diet for now. Last dose of Humira was on 11/30/13. - Continue Lialda. - As discussed with GI, hold off on starting steroids. - Check C. difficile PCR and GI stool pathogen panel PCR  Symptomatic anemia - Likely secondary to slow rectal bleeding and chronic disease. - Transfuse 3 units of PRBCs and follow CBCs. Aim to keep hemoglobin greater than 8 g per DL  Mild dehydration with hyponatremia - Brief IV normal saline  Hypokalemia - Replete and follow BMP  History of recurrent C. difficile infection - Check stool C. difficile PCR and place on contact isolation until ruled out. - Avoid unnecessary antibiotics and PPIs.  Intermittent fevers - Check C. difficile PCR, blood cultures if temperature is greater  than 101F. Check urine microscopy & chest x-ray. No antibiotics at this time.  Hyperglycemia - Check hemoglobin A1c and place on sliding scale insulin.  Code Status: Full  Family Communication: None at bedside  Disposition Plan: Home when medically stable   Time spent: 65 minutes.  Marcellus Scott, MD, FACP, FHM. [facility] Pager [phone]  If 7PM-7AM, please contact night-coverage [email or web address] Password [email or web address] 12/03/2013, 9:15 PM

## 2013-12-03 NOTE — ED Notes (Signed)
Pt states she has had a lot of rectal bleeding with bright red blood. Pt is taking iron tablets and Humira for her ulcerative colitis. Pt has hx of blood transfusions and low hgb. Pt states she was short of breath with exertion this weekend and her heart was pounding.  Pt alert, no acute distress. Skin warm, dry. Pt denies pain at present.

## 2013-12-03 NOTE — ED Notes (Signed)
Bed: ZO10 Expected date: 12/03/13 Expected time:  Means of arrival:  Comments: Pt returning to room

## 2013-12-03 NOTE — ED Provider Notes (Signed)
CSN: [id number]     Arrival date & time 12/03/13  1655 History   First MD Initiated Contact with Patient 12/03/13 1734     Chief Complaint  Patient presents with  . Rectal Bleeding   (Consider location/radiation/quality/duration/timing/severity/associated sxs/prior Treatment) HPI Comments: 37 year old female with poorly-controlled ulcerative colitis presents with symptomatic anemia. She's been having rectal bleeding and frequent stools for several months and which she describes as "uncontrolled ulcerative colitis exacerbation". Patient had a transfusion a couple months ago in [place] when she is visiting her mother-in-law who is ill. She feels like she was more ill as her mother-in-law is ill. She states she's tried multiple medications and her gastroenterologist convinced her to start Humira, which she started one month ago. She's continued to have bleeding and feels like maybe getting a little worse. She denies any abdominal pain or distention. She states that over the past 2 days getting progressively shorter of breath and has been not been able to walk nearly as far as normal. She follows with an OB/GYN in [place] and asked to be seen for some blood work and they found her hemoglobin to be 5.4. She called her gastroenterologist but was unable to get in touch and so she decided to come to the ED for transfusion. She also notes that she had a fecal transplant one year ago for recurrent C. difficile has been tested multiple times and been negative.   Past Medical History  Diagnosis Date  . Ulcerative colitis   . Clostridium difficile infection   . History of blood transfusion    History reviewed. No pertinent past surgical history. No family history on file. History  Substance Use Topics  . Smoking status: Never Smoker   . Smokeless tobacco: Not on file  . Alcohol Use: Yes     Comment: occ   OB History   Grav Para Term Preterm Abortions TAB SAB Ect Mult Living                  Review of Systems  Constitutional: Positive for fatigue.  Respiratory: Positive for shortness of breath.   Cardiovascular: Negative for chest pain.  Gastrointestinal: Positive for diarrhea, blood in stool and hematochezia. Negative for vomiting, abdominal pain and abdominal distention.    Allergies  Budesonide  Home Medications   Current Outpatient Rx  Name  Route  Sig  Dispense  Refill  . acetaminophen (TYLENOL) 500 MG tablet   Oral   Take 1,000 mg by mouth every 6 (six) hours as needed for moderate pain or fever.          . Adalimumab (HUMIRA PEN [place])   Subcutaneous   Inject 1 pen into the skin every 14 (fourteen) days.         Marland Kitchen ibuprofen (ADVIL,MOTRIN) 200 MG tablet   Oral   Take 800 mg by mouth every 6 (six) hours as needed for fever or moderate pain.          . mesalamine (LIALDA) 1.2 G EC tablet   Oral   Take 1,200 mg by mouth 4 (four) times daily.         Marland Kitchen zolpidem (AMBIEN) 10 MG tablet   Oral   Take 5-10 mg by mouth at bedtime as needed for sleep.          BP 123/73  Pulse 117  Temp(Src) 98.3 F (36.8 C) (Oral)  Resp 16  SpO2 100%  LMP 09/30/2013 Physical Exam  Nursing note and vitals reviewed.  Constitutional: She is oriented to person, place, and time. She appears well-developed and well-nourished.  HENT:  Head: Normocephalic and atraumatic.  Right Ear: External ear normal.  Left Ear: External ear normal.  Nose: Nose normal.  Eyes: Right eye exhibits no discharge. Left eye exhibits no discharge.  Cardiovascular: Regular rhythm and normal heart sounds.  Tachycardia present.   Pulmonary/Chest: Effort normal and breath sounds normal.  Abdominal: Soft. She exhibits no distension. There is no tenderness.  Neurological: She is alert and oriented to person, place, and time.  Skin: Skin is warm and dry.    ED Course  Procedures (including critical care time) Labs Review Labs Reviewed  CBC - Abnormal; Notable for the following:    RBC 2.56  (*)    Hemoglobin 5.1 (*)    HCT 17.9 (*)    MCV 69.9 (*)    MCH 19.9 (*)    MCHC 28.5 (*)    RDW 17.8 (*)    Platelets 410 (*)    All other components within normal limits  BASIC METABOLIC PANEL - Abnormal; Notable for the following:    Sodium 130 (*)    Potassium 3.3 (*)    Chloride 95 (*)    Glucose, Bld 164 (*)    GFR calc non Af Amer 90 (*)    All other components within normal limits  CLOSTRIDIUM DIFFICILE BY PCR  PROTIME-INR  CG4 I-STAT (LACTIC ACID)  TYPE AND SCREEN  PREPARE RBC (CROSSMATCH)  PREPARE RBC (CROSSMATCH)  ABO/RH   Imaging Review No results found.  EKG Interpretation   None       MDM   1. Ulcerative colitis   2. History of blood transfusion   3.  Symptomatic Anemia  Patient with progressive bloody stools consistent with her ulcerative colitis with now symptomatic anemia. She is also tachycardic which improved with some fluid resuscitation. Given the degree of her anemia she will need inpatient admission for blood transfusion and likely inpatient GI consult. She is showing no abdominal pain or distention to be concerned for other ulcerative colitis pathology.    Audree Camel, MD 12/03/13 [id number]

## 2013-12-03 NOTE — ED Notes (Signed)
POCT I stat Lactic given to Dr. Criss Alvine.

## 2013-12-04 DIAGNOSIS — K5180 Other ulcerative colitis without complications: Secondary | ICD-10-CM

## 2013-12-04 DIAGNOSIS — K5190 Ulcerative colitis, unspecified, without complications: Principal | ICD-10-CM

## 2013-12-04 LAB — CBC WITH DIFFERENTIAL/PLATELET
Eosinophils Relative: 9 % — ABNORMAL HIGH (ref 0–5)
Lymphocytes Relative: 21 % (ref 12–46)
Lymphs Abs: 1.4 10*3/uL (ref 0.7–4.0)
MCH: 24.4 pg — ABNORMAL LOW (ref 26.0–34.0)
MCV: 76.4 fL — ABNORMAL LOW (ref 78.0–100.0)
Monocytes Absolute: 1.1 10*3/uL — ABNORMAL HIGH (ref 0.1–1.0)
Monocytes Relative: 16 % — ABNORMAL HIGH (ref 3–12)
Neutrophils Relative %: 53 % (ref 43–77)
Platelets: 339 10*3/uL (ref 150–400)
RBC: 3.52 MIL/uL — ABNORMAL LOW (ref 3.87–5.11)
RDW: 19.2 % — ABNORMAL HIGH (ref 11.5–15.5)
WBC: 6.8 10*3/uL (ref 4.0–10.5)

## 2013-12-04 LAB — BASIC METABOLIC PANEL
Chloride: 102 mEq/L (ref 96–112)
Creatinine, Ser: 0.61 mg/dL (ref 0.50–1.10)
GFR calc Af Amer: >90 mL/min (ref >90)
GFR calc non Af Amer: >90 mL/min (ref >90)
Potassium: 3.6 mEq/L (ref 3.5–5.1)
Sodium: 134 mEq/L — ABNORMAL LOW (ref 135–145)

## 2013-12-04 LAB — GI PATHOGEN PANEL BY PCR, STOOL
C difficile toxin A/B: NEGATIVE
Campylobacter by PCR: NEGATIVE
Cryptosporidium by PCR: NEGATIVE
E coli (ETEC) LT/ST: NEGATIVE
E coli (STEC): NEGATIVE
E coli 0157 by PCR: NEGATIVE
G lamblia by PCR: NEGATIVE
Norovirus GI/GII: NEGATIVE
Shigella by PCR: POSITIVE

## 2013-12-04 LAB — OCCULT BLOOD X 1 CARD TO LAB, STOOL: Fecal Occult Bld: NEGATIVE

## 2013-12-04 LAB — HEMOGLOBIN A1C: Mean Plasma Glucose: 108 mg/dL (ref <117)

## 2013-12-04 LAB — GLUCOSE, CAPILLARY: Glucose-Capillary: 83 mg/dL (ref 70–99)

## 2013-12-04 NOTE — Consult Note (Signed)
Consultation  Referring Provider:      Primary Care Physician:  No primary provider on file. Primary Gastroenterologist: Dr. Raeanne Barry        Reason for Consultation:  Ulcerative colitis          HPI:   Morgan [name] is a 37 y.o. female diagnosed in 2008 with ulcerative colitis. She has been followed by Dr. Steele Sizer at [facility]. Patient has a history of recurrent C-diff in 2008, 2010 and 2013.   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   Since July she has had ongoing flare type symptoms consisting of liquid stools with blood despite adding a prolonged course of steroids to her Lialda and Azathioprine. Uceris also tried but caused severe headaches.  No significant abdominal pain. She has had fever reports frequent fevers of 101-102 at night for months. Per patient stool studies negative for c-diff twice since July. Patient ultimately started Humira mid November.  She only got 1 of the 2 injections of second dose 11/28. Her 3rd dose was 12/12. Over the last week stools slightly more frequent, averaging 4-5 loose stools during the day and another 3-4 during the night. Patient cannot always tell when there is blood because of the iron she takes but has definitely seen red blood in stool lately. Over the weekend she became SOB, tired. Hgb at GYN's office yesterday was 5-6.   Last colonoscopy in September 2014.    Past Medical History  Diagnosis Date  . Ulcerative colitis   . Clostridium difficile infection   . History of blood transfusion     Past Surgical History  Procedure Laterality Date  . Wisdom tooth extraction  1998  . Redo epistomy    . Thyro glossal duct cyst  2010    [facility]: colon cancer in paternal grandfather in his 34's. Cousin died age 90 of colon cancer.   History  Substance Use Topics  . Smoking status: Never Smoker   . Smokeless tobacco: Never Used  . Alcohol Use: Yes     Comment: occ    Prior to Admission medications   Medication Sig Start  Date End Date Taking? Authorizing Provider  acetaminophen (TYLENOL) 500 MG tablet Take 1,000 mg by mouth every 6 (six) hours as needed for moderate pain or fever.    Yes Historical Provider, MD  Adalimumab (HUMIRA PEN [place]) Inject 1 pen into the skin every 14 (fourteen) days.   Yes Historical Provider, MD  ibuprofen (ADVIL,MOTRIN) 200 MG tablet Take 800 mg by mouth every 6 (six) hours as needed for fever or moderate pain.    Yes Historical Provider, MD  mesalamine (LIALDA) 1.2 G EC tablet Take 1,200 mg by mouth 4 (four) times daily.   Yes Historical Provider, MD  zolpidem (AMBIEN) 10 MG tablet Take 5-10 mg by mouth at bedtime as needed for sleep.   Yes Historical Provider, MD    Current Facility-Administered Medications  Medication Dose Route Frequency Provider Last Rate Last Dose  . acetaminophen (TYLENOL) tablet 650 mg  650 mg Oral Q6H PRN Elease Etienne, MD       Or  . acetaminophen (TYLENOL) suppository 650 mg  650 mg Rectal Q6H PRN Elease Etienne, MD      . albuterol (PROVENTIL) (5 MG/ML) 0.5% nebulizer solution 2.5 mg  2.5 mg Nebulization Q2H PRN Elease Etienne, MD      . insulin aspart (novoLOG) injection 0-9 Units  0-9 Units Subcutaneous TID WC  Elease Etienne, MD      . mesalamine (LIALDA) EC tablet 1.2 g  1,200 mg Oral QID Elease Etienne, MD   1.2 g at 12/04/13 1610  . ondansetron (ZOFRAN) tablet 4 mg  4 mg Oral Q6H PRN Elease Etienne, MD       Or  . ondansetron (ZOFRAN) injection 4 mg  4 mg Intravenous Q6H PRN Elease Etienne, MD      . sodium chloride 0.9 % injection 3 mL  3 mL Intravenous Q12H Elease Etienne, MD      . zolpidem (AMBIEN) tablet 5 mg  5 mg Oral QHS PRN Elease Etienne, MD        Allergies as of 12/03/2013 - Review Complete 12/03/2013  Allergen Reaction Noted  . Budesonide Other (See Comments) 12/03/2013   Review of Systems:    All systems reviewed and negative except where noted in HPI.   Physical Exam:  Vital signs in last 24 hours: Temp:   [98.2 F (36.8 C)-99.2 F (37.3 C)] 98.2 F (36.8 C) (12/16 1020) Pulse Rate:  [86-117] 94 (12/16 1020) Resp:  [14-20] 16 (12/16 1020) BP: (98-127)/(54-84) 117/84 mmHg (12/16 1020) SpO2:  [94 %-100 %] 100 % (12/16 0738) Weight:  [132 lb 7.9 oz (60.1 kg)] 132 lb 7.9 oz (60.1 kg) (12/15 2210) Last BM Date: 12/03/13 General:   Pleasant white female in NAD Head:  Normocephalic and atraumatic. Eyes:   No icterus.   Conjunctiva pink. Ears:  Normal auditory acuity. Neck:  Supple; no masses felt Lungs:  Respirations even and unlabored. Lungs clear to auscultation bilaterally.   No wheezes, crackles, or rhonchi.  Heart:  Regular rate and rhythm Abdomen:  Soft, nondistended, nontender. Normal bowel sounds. No appreciable masses or hepatomegaly.  Rectal:  Not performed.  Msk:  Symmetrical without gross deformities.  Extremities:  Without edema. Neurologic:  Alert and  oriented x4;  grossly normal neurologically. Skin:  Intact without significant lesions or rashes. Cervical Nodes:  No significant cervical adenopathy. Psych:  Alert and cooperative. Normal affect.  LAB RESULTS:  Recent Labs  12/03/13 1919  WBC 8.4  HGB 5.1*  HCT 17.9*  PLT 410*   BMET  Recent Labs  12/03/13 1919  NA 130*  K 3.3*  CL 95*  CO2 22  GLUCOSE 164*  BUN 10  CREATININE 0.82  CALCIUM 8.8   LFT No results found for this basename: PROT, ALBUMIN, AST, ALT, ALKPHOS, BILITOT, BILIDIR, IBILI,  in the last 72 hours PT/INR  Recent Labs  12/03/13 1919  LABPROT 13.6  INR 1.06    STUDIES: X-ray Chest Pa And Lateral   12/03/2013   CLINICAL DATA:  Cough with yellow sputum production  EXAM: CHEST  2 VIEW  COMPARISON:  None  FINDINGS: Normal heart size, mediastinal contours, and pulmonary vascularity.  Lungs hyperaerated but clear.  No pleural effusion or pneumothorax.  Bones unremarkable.  IMPRESSION: Hyperaerated lungs.  Otherwise negative exam.   Electronically Signed   By: Ulyses Southward M.D.   On:  12/03/2013 23:25     PREVIOUS ENDOSCOPIES:            Done at [facility] - Dr. Steele Sizer   Impression / Plan:    37 year old female with UC diagnosed in 2008. She is followed closely by  Dr. Steele Sizer at [facility]. Since starting biologics approx one month ago she has had a few solid stools but overall stools still frequent / loose with blood.  Patient has history of recurrent c-diff. Stool pathogen panel is pending. Patient should be on isolation until c-diff results known. Patient feels better post-transfusion. She should follow up with her primary gastroenterology Dr. Steele Sizer later this week or early next week.    Thanks   LOS: 1 day   Willette Cluster  12/04/2013, 10:39 AM  Chart was reviewed and patient was examined. X-rays and lab were reviewed.    I agree with management and plans.  Since patient plans followup with her primary gastroenterologist in 2 days will not do any diagnostic tests or change regimen.  Barbette Hair. Arlyce Dice, M.D., [facility] Gastroenterology Cell [phone]

## 2013-12-04 NOTE — Care Management Note (Signed)
    Page 1 of 1   12/04/2013     11:10:55 AM   CARE MANAGEMENT NOTE 12/04/2013  Patient:  Morgan [name], Morgan [name]   Account Number:  [id number]  Date Initiated:  12/04/2013  Documentation initiated by:  Lanier Clam  Subjective/Objective Assessment:   37 Y/O F ADMITTED W/DIARRHEA,WEAKNESS.ULCERATIVE COLITIS,GIB.     Action/Plan:   FROM HOME.HAS PCP,PHARMACY.   Anticipated DC Date:  12/07/2013   Anticipated DC Plan:  HOME/SELF CARE      DC Planning Services  CM consult      Choice offered to / List presented to:             Status of service:  In process, will continue to follow Medicare Important Message given?   (If response is "NO", the following Medicare IM given date fields will be blank) Date Medicare IM given:   Date Additional Medicare IM given:    Discharge Disposition:    Per UR Regulation:  Reviewed for med. necessity/level of care/duration of stay  If discussed at Long Length of Stay Meetings, dates discussed:    Comments:  12/04/13 [facility] RN,BSN NCM [phone]

## 2013-12-04 NOTE — Progress Notes (Signed)
Patient has an order for SCD's. Patient stated that since she is having bowel urgency she would feel better about not wearing the SCD's in case she needs to rush to the restroom. Patient is aware of the importance of this device. Patient is up walking around in the room. Will continue to monitor the patient.

## 2013-12-04 NOTE — Discharge Summary (Signed)
Physician Discharge Summary  Morgan [name] ION:[id number] DOB: November 29, 1976 DOA: 12/03/2013  PCP: No primary provider on file.  Admit date: 12/03/2013 Discharge date: 12/04/2013  Time spent: 15 minutes  Recommendations for Outpatient Follow-up:  1. Patient left AGAINST MEDICAL ADVICE understands implications of doing so, has appointment set for 12/17 a.m. with [facility] gastroenterologist who is recommended that he followup GI pathogen panel  Discharge Diagnoses:  Principal Problem:   Ulcerative colitis Active Problems:   GI bleed   History of blood transfusion   Anemia   Hyponatremia   Hypokalemia   Hyperglycemia   H/O Clostridium difficile infection   Ulcerative colitis with rectal bleeding   Discharge Condition: Fair to guarded  Diet recommendation: Low residue  Filed Weights   12/03/13 2210 12/04/13 1020  Weight: 60.1 kg (132 lb 7.9 oz) 59.2 kg (130 lb 8.2 oz)    History of present illness:  37 y.o. female diagnosed in 2008 with ulcerative colitis. She has been followed by Dr. Steele Sizer at [facility]. 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  Since July she has had ongoing flare type symptoms consisting of liquid stools with blood despite adding a prolonged course of steroids to her Lialda and Azathioprine. Uceris also tried but caused severe headaches. No significant abdominal pain. She has had fever reports frequent fevers of 101-102 at night for months. Per patient stool studies negative for c-diff twice since July. Patient ultimately started Humira mid November. She only got 1 of the 2 injections of second dose 11/28. Her 3rd dose was 12/12. Over the last week stools slightly more frequent, averaging 4-5 loose stools during the day and another 3-4 during the night Patient was transfused 3 units packed red blood cells and GI pathogen panel was ordered and is pending Patient requests to leave AGAINST  MEDICAL ADVICE as she had dressing social matter is to take care of Explained in detail to the patient is benefits of doing the same in addition to recent stomach hospital she understands.   Consultations:  Gastroenterology  Discharge Exam: Filed Vitals:   12/04/13 1319  BP: 112/80  Pulse: 96  Temp: 98.2 F (36.8 C)  Resp: 16    General: EOMI NCAT Cardiovascular: S1-S2 no rub or gallop Respiratory: Clinically clear  Discharge Instructions  Discharge Orders   Future Orders Complete By Expires   Diet - low sodium heart healthy  As directed    Increase activity slowly  As directed        Medication List    STOP taking these medications       ibuprofen 200 MG tablet  Commonly known as:  ADVIL,MOTRIN      TAKE these medications       acetaminophen 500 MG tablet  Commonly known as:  TYLENOL  Take 1,000 mg by mouth every 6 (six) hours as needed for moderate pain or fever.     HUMIRA PEN [place]  Inject 1 pen into the skin every 14 (fourteen) days.     mesalamine 1.2 G EC tablet  Commonly known as:  LIALDA  Take 1,200 mg by mouth 4 (four) times daily.     zolpidem 10 MG tablet  Commonly known as:  AMBIEN  Take 5-10 mg by mouth at bedtime as needed for sleep.       Allergies  Allergen Reactions  . Budesonide Other (See Comments)    Brain swelling  The results of significant diagnostics from this hospitalization (including imaging, microbiology, ancillary and laboratory) are listed below for reference.    Significant Diagnostic Studies: X-ray Chest Pa And Lateral   12/03/2013   CLINICAL DATA:  Cough with yellow sputum production  EXAM: CHEST  2 VIEW  COMPARISON:  None  FINDINGS: Normal heart size, mediastinal contours, and pulmonary vascularity.  Lungs hyperaerated but clear.  No pleural effusion or pneumothorax.  Bones unremarkable.  IMPRESSION: Hyperaerated lungs.  Otherwise negative exam.   Electronically Signed   By: Ulyses Southward M.D.   On: 12/03/2013  23:25    Microbiology: No results found for this or any previous visit (from the past 240 hour(s)).   Labs: Basic Metabolic Panel:  Recent Labs Lab 12/03/13 1919 12/04/13 1218  NA 130* 134*  K 3.3* 3.6  CL 95* 102  CO2 22 24  GLUCOSE 164* 103*  BUN 10 3*  CREATININE 0.82 0.61  CALCIUM 8.8 8.4   Liver Function Tests: No results found for this basename: AST, ALT, ALKPHOS, BILITOT, PROT, ALBUMIN,  in the last 168 hours No results found for this basename: LIPASE, AMYLASE,  in the last 168 hours No results found for this basename: AMMONIA,  in the last 168 hours CBC:  Recent Labs Lab 12/03/13 1919 12/04/13 1225  WBC 8.4 6.8  NEUTROABS  --  3.6  HGB 5.1* 8.6*  HCT 17.9* 26.9*  MCV 69.9* 76.4*  PLT 410* 339   Cardiac Enzymes: No results found for this basename: CKTOTAL, CKMB, CKMBINDEX, TROPONINI,  in the last 168 hours BNP: BNP (last 3 results) No results found for this basename: PROBNP,  in the last 8760 hours CBG:  Recent Labs Lab 12/03/13 2146 12/04/13 0741  GLUCAP 119* 83       Signed:  Rhetta Mura  [facility] 12/04/2013, 1:36 PM

## 2013-12-05 LAB — TYPE AND SCREEN
Antibody Screen: NEGATIVE
Unit division: 0
Unit division: 0

## 2013-12-28 DIAGNOSIS — K921 Melena: Secondary | ICD-10-CM | POA: Insufficient documentation

## 2014-01-20 HISTORY — PX: SUBTOTAL COLECTOMY: SHX855

## 2014-02-04 ENCOUNTER — Inpatient Hospital Stay (HOSPITAL_COMMUNITY)
Admission: EM | Admit: 2014-02-04 | Discharge: 2014-02-05 | DRG: 812 | Disposition: A | Discharge status: Home / Self Care | Payer: Managed Care, Other (non HMO) | Attending: Internal Medicine | Admitting: Internal Medicine

## 2014-02-04 ENCOUNTER — Encounter (HOSPITAL_COMMUNITY): Payer: Self-pay | Admitting: Emergency Medicine

## 2014-02-04 DIAGNOSIS — K51911 Ulcerative colitis, unspecified with rectal bleeding: Secondary | ICD-10-CM

## 2014-02-04 DIAGNOSIS — Z8619 Personal history of other infectious and parasitic diseases: Secondary | ICD-10-CM | POA: Diagnosis present

## 2014-02-04 DIAGNOSIS — K5190 Ulcerative colitis, unspecified, without complications: Secondary | ICD-10-CM | POA: Diagnosis present

## 2014-02-04 DIAGNOSIS — Z79899 Other long term (current) drug therapy: Secondary | ICD-10-CM

## 2014-02-04 DIAGNOSIS — Z800 Family history of malignant neoplasm of digestive organs: Secondary | ICD-10-CM

## 2014-02-04 DIAGNOSIS — K5180 Other ulcerative colitis without complications: Secondary | ICD-10-CM

## 2014-02-04 DIAGNOSIS — Z9289 Personal history of other medical treatment: Secondary | ICD-10-CM

## 2014-02-04 DIAGNOSIS — D500 Iron deficiency anemia secondary to blood loss (chronic): Principal | ICD-10-CM | POA: Diagnosis present

## 2014-02-04 LAB — CBC WITH DIFFERENTIAL/PLATELET
BASOS ABS: 0 10*3/uL (ref 0.0–0.1)
BASOS PCT: 0 % (ref 0–1)
Eosinophils Absolute: 0 10*3/uL (ref 0.0–0.7)
Eosinophils Relative: 0 % (ref 0–5)
HCT: 20.2 % — ABNORMAL LOW (ref 36.0–46.0)
Hemoglobin: 5.9 g/dL — CL (ref 12.0–15.0)
Lymphocytes Relative: 11 % — ABNORMAL LOW (ref 12–46)
Lymphs Abs: 0.8 10*3/uL (ref 0.7–4.0)
MCH: 22.8 pg — AB (ref 26.0–34.0)
MCHC: 29.2 g/dL — ABNORMAL LOW (ref 30.0–36.0)
MCV: 78 fL (ref 78.0–100.0)
Monocytes Absolute: 0.4 10*3/uL (ref 0.1–1.0)
Monocytes Relative: 5 % (ref 3–12)
NEUTROS ABS: 6.2 10*3/uL (ref 1.7–7.7)
NEUTROS PCT: 84 % — AB (ref 43–77)
PLATELETS: 298 10*3/uL (ref 150–400)
RBC: 2.59 MIL/uL — AB (ref 3.87–5.11)
RDW: 18.3 % — ABNORMAL HIGH (ref 11.5–15.5)
WBC: 7.3 10*3/uL (ref 4.0–10.5)

## 2014-02-04 LAB — URINALYSIS, ROUTINE W REFLEX MICROSCOPIC
BILIRUBIN URINE: NEGATIVE
Glucose, UA: NEGATIVE mg/dL
Hgb urine dipstick: NEGATIVE
Ketones, ur: NEGATIVE mg/dL
LEUKOCYTES UA: NEGATIVE
Nitrite: NEGATIVE
PH: 6 (ref 5.0–8.0)
Protein, ur: NEGATIVE mg/dL
Specific Gravity, Urine: 1.015 (ref 1.005–1.030)
Urobilinogen, UA: 0.2 mg/dL (ref 0.0–1.0)

## 2014-02-04 LAB — CG4 I-STAT (LACTIC ACID): LACTIC ACID, VENOUS: 1.09 mmol/L (ref 0.5–2.2)

## 2014-02-04 LAB — COMPREHENSIVE METABOLIC PANEL
ALBUMIN: 2.7 g/dL — AB (ref 3.5–5.2)
ALK PHOS: 67 U/L (ref 39–117)
ALT: 6 U/L (ref 0–35)
AST: 11 U/L (ref 0–37)
BUN: 10 mg/dL (ref 6–23)
CHLORIDE: 96 meq/L (ref 96–112)
CO2: 26 mEq/L (ref 19–32)
Calcium: 8.5 mg/dL (ref 8.4–10.5)
Creatinine, Ser: 0.69 mg/dL (ref 0.50–1.10)
GFR calc Af Amer: >90 mL/min (ref >90)
GFR calc non Af Amer: >90 mL/min (ref >90)
Glucose, Bld: 109 mg/dL — ABNORMAL HIGH (ref 70–99)
POTASSIUM: 3.8 meq/L (ref 3.7–5.3)
SODIUM: 134 meq/L — AB (ref 137–147)
TOTAL PROTEIN: 7 g/dL (ref 6.0–8.3)
Total Bilirubin: 0.2 mg/dL — ABNORMAL LOW (ref 0.3–1.2)

## 2014-02-04 LAB — PREPARE RBC (CROSSMATCH)

## 2014-02-04 LAB — PROTIME-INR
INR: 1.05 (ref 0.00–1.49)
PROTHROMBIN TIME: 13.5 s (ref 11.6–15.2)

## 2014-02-04 LAB — PREGNANCY, URINE: Preg Test, Ur: NEGATIVE

## 2014-02-04 MED ORDER — SODIUM CHLORIDE 0.9 % IJ SOLN
3.0000 mL | Freq: Two times a day (BID) | INTRAMUSCULAR | Status: DC
  Administered 2014-02-04: 3 mL via INTRAVENOUS

## 2014-02-04 MED ORDER — ACETAMINOPHEN 650 MG RE SUPP: 650.0000 mg | Freq: Four times a day (QID) | RECTAL | Status: DC | PRN

## 2014-02-04 MED ORDER — POLYSACCHARIDE IRON COMPLEX 150 MG PO CAPS
150.0000 mg | ORAL_CAPSULE | Freq: Two times a day (BID) | ORAL | Status: DC
  Administered 2014-02-04 – 2014-02-05 (×2): 150 mg via ORAL
  Filled 2014-02-04 (×3): qty 1

## 2014-02-04 MED ORDER — MORPHINE SULFATE 2 MG/ML IJ SOLN
1.0000 mg | INTRAMUSCULAR | Status: DC | PRN
Start: 2014-02-04 — End: 2014-02-05

## 2014-02-04 MED ORDER — MESALAMINE 1.2 G PO TBEC
1200.0000 mg | DELAYED_RELEASE_TABLET | Freq: Four times a day (QID) | ORAL | Status: DC
Start: 2014-02-04 — End: 2014-02-05
  Administered 2014-02-04 – 2014-02-05 (×2): 1.2 g via ORAL
  Filled 2014-02-04 (×5): qty 1

## 2014-02-04 MED ORDER — SODIUM CHLORIDE 0.9 % IV SOLN: 250.0000 mL | INTRAVENOUS | Status: DC | PRN

## 2014-02-04 MED ORDER — ONDANSETRON HCL 4 MG PO TABS: 4.0000 mg | ORAL_TABLET | Freq: Four times a day (QID) | ORAL | Status: DC | PRN

## 2014-02-04 MED ORDER — PREDNISONE 10 MG PO TABS
10.0000 mg | ORAL_TABLET | Freq: Two times a day (BID) | ORAL | Status: DC
  Administered 2014-02-05: 10 mg via ORAL
  Filled 2014-02-04 (×3): qty 1

## 2014-02-04 MED ORDER — SODIUM CHLORIDE 0.9 % IJ SOLN: 3.0000 mL | INTRAMUSCULAR | Status: DC | PRN

## 2014-02-04 MED ORDER — ONDANSETRON HCL 4 MG/2ML IJ SOLN: 4.0000 mg | Freq: Four times a day (QID) | INTRAMUSCULAR | Status: DC | PRN

## 2014-02-04 MED ORDER — ZOLPIDEM TARTRATE 5 MG PO TABS
5.0000 mg | ORAL_TABLET | Freq: Every evening | ORAL | Status: DC | PRN
  Administered 2014-02-04 (×2): 5 mg via ORAL
  Filled 2014-02-04: qty 2
  Filled 2014-02-04: qty 1

## 2014-02-04 MED ORDER — SODIUM CHLORIDE 0.9 % IJ SOLN
3.0000 mL | Freq: Two times a day (BID) | INTRAMUSCULAR | Status: DC
  Administered 2014-02-05: 3 mL via INTRAVENOUS

## 2014-02-04 MED ORDER — SODIUM CHLORIDE 0.9 % IV BOLUS (SEPSIS)
1000.0000 mL | Freq: Once | INTRAVENOUS | Status: AC
  Administered 2014-02-04: 1000 mL via INTRAVENOUS

## 2014-02-04 MED ORDER — ACETAMINOPHEN 325 MG PO TABS: 650.0000 mg | ORAL_TABLET | Freq: Four times a day (QID) | ORAL | Status: DC | PRN

## 2014-02-04 MED ORDER — PREDNISONE 20 MG PO TABS
20.0000 mg | ORAL_TABLET | Freq: Once | ORAL | Status: DC
  Filled 2014-02-04: qty 1

## 2014-02-04 MED ORDER — AZATHIOPRINE 50 MG PO TABS
50.0000 mg | ORAL_TABLET | Freq: Every day | ORAL | Status: DC
  Administered 2014-02-05: 50 mg via ORAL
  Filled 2014-02-04 (×2): qty 1

## 2014-02-04 NOTE — ED Notes (Signed)
Per pt, last transfusion was Jan 23 rd-had HgB checked at [facility]BGYN and it was 6.4-history of ulcerative colitis-surgery next week

## 2014-02-04 NOTE — ED Provider Notes (Signed)
CSN: [id number]     Arrival date & time 02/04/14  1417 History   First MD Initiated Contact with Patient 02/04/14 1513     Chief Complaint  Patient presents with  . low HgB      (Consider location/radiation/quality/duration/timing/severity/associated sxs/prior Treatment) The history is provided by the patient. No language interpreter was used.  Quintana Canelo is a 38 y/o F with PMHx of ulcerative colitis that is poorly controlled - patient is due to have colectomy performed on 02/13/2014 by Dr. Delma Post, presenting to the ED with low Hgb. Patient reported that she was seen by her OBGYN today who reported that her Hgb was 6.34 - was recommended to come to the ED to get a transfusion. Stated that she was feeling fatigued yesterday and reported that it took a lot of energy for her to get out of bed today. Stated that when she walkas around she gets short of breath. Stated that she has been having bloody stools - reported dark, secondary to taking iron. Patient reported that she has had blood transfusions x 4 within the past year. Reported that she has one in October 2014 at [facility], December 2014 at [facility], and January 2015 at [facility] x 2 - most recent was 01/11/2014. Denied fever, abdominal pain, nausea, vomiting, chest pain, difficulty breathing.  PCP none   Past Medical History  Diagnosis Date  . Ulcerative colitis   . Clostridium difficile infection   . History of blood transfusion    Past Surgical History  Procedure Laterality Date  . Wisdom tooth extraction  1998  . Redo epistomy    . Thyro glossal duct cyst  2010   History reviewed. No pertinent family history. History  Substance Use Topics  . Smoking status: Never Smoker   . Smokeless tobacco: Never Used  . Alcohol Use: Yes     Comment: occ   OB History   Grav Para Term Preterm Abortions TAB SAB Ect Mult Living                 Review of Systems  Constitutional: Positive for fatigue. Negative for fever and chills.   Respiratory: Positive for shortness of breath. Negative for chest tightness.   Cardiovascular: Negative for chest pain.  Gastrointestinal: Positive for blood in stool. Negative for nausea, vomiting, abdominal pain and diarrhea.  Genitourinary: Negative for decreased urine volume.  Musculoskeletal: Negative for back pain and neck pain.  Neurological: Positive for weakness. Negative for dizziness.  All other systems reviewed and are negative.      Allergies  Budesonide  Home Medications   Current Outpatient Rx  Name  Route  Sig  Dispense  Refill  . acetaminophen (TYLENOL) 500 MG tablet   Oral   Take 1,000 mg by mouth every 6 (six) hours as needed for moderate pain or fever.          Marland Kitchen azaTHIOprine (IMURAN) 50 MG tablet   Oral   Take 50 mg by mouth daily.         . mesalamine (LIALDA) 1.2 G EC tablet   Oral   Take 1,200 mg by mouth 4 (four) times daily.         . Polysaccharide Iron Complex (POLY-IRON 150 PO)   Oral   Take 1 tablet by mouth 2 (two) times daily.         . predniSONE (DELTASONE) 10 MG tablet   Oral   Take 20 mg by mouth daily.         Marland Kitchen  Probiotic Product (PROBIOTIC DAILY PO)   Oral   Take 1 capsule by mouth daily.         Marland Kitchen zolpidem (AMBIEN) 10 MG tablet   Oral   Take 5-10 mg by mouth at bedtime as needed for sleep.         . Adalimumab (HUMIRA PEN [place])   Subcutaneous   Inject 1 pen into the skin every 7 (seven) days.           BP 114/77  Pulse 121  Temp(Src) 98.5 F (36.9 C) (Oral)  Resp 16  SpO2 100%  LMP 01/06/2014 Physical Exam  Nursing note and vitals reviewed. Constitutional: She is oriented to person, place, and time. She appears well-developed and well-nourished. No distress.  HENT:  Head: Normocephalic and atraumatic.  Mouth/Throat: No oropharyngeal exudate.  Dry mucus membranes  Eyes: Conjunctivae and EOM are normal. Pupils are equal, round, and reactive to light. Right eye exhibits no discharge. Left eye  exhibits no discharge. No scleral icterus.  Neck: Normal range of motion. Neck supple. No tracheal deviation present.  Cardiovascular: Regular rhythm and normal heart sounds.  Exam reveals no friction rub.   No murmur heard. Tachycardia noted upon auscultation   Pulmonary/Chest: Effort normal and breath sounds normal. No respiratory distress. She has no wheezes. She has no rales.  Abdominal: Soft. Bowel sounds are normal. There is no tenderness. There is no guarding.  Genitourinary:  Rectal exam: Negative swelling, erythema, inflammation, lesions, sores, hemorrhoids noted to the anus. Negative masses, lesions, sores palpated to the rectum. Mild pink noted to glove. Negative bright red blood on glove.  Exam chaperoned.   Musculoskeletal: Normal range of motion.  Full ROM to upper and lower extremities without difficulty noted, negative ataxia noted.  Lymphadenopathy:    She has no cervical adenopathy.  Neurological: She is alert and oriented to person, place, and time. No cranial nerve deficit. She exhibits normal muscle tone. Coordination normal.  Strength 5+/5+ to upper and lower extremities bilaterally with resistance applied, equal distribution noted  Skin: Skin is warm and dry. She is not diaphoretic. There is pallor.  Psychiatric: She has a normal mood and affect. Her behavior is normal. Thought content normal.    ED Course  Procedures (including critical care time)  5:35 PM This provider spoke with Dr. Ephriam Knuckles, [facility] - discussed case, history, presentation, labs. Physician reported that he is going to see and assess the patient.   Patient reported that she is only able to receive irridated blood - irridated blood ordered.   Results for orders placed during the hospital encounter of 02/04/14  CBC WITH DIFFERENTIAL      Result Value Ref Range   WBC 7.3  4.0 - 10.5 K/uL   RBC 2.59 (*) 3.87 - 5.11 MIL/uL   Hemoglobin 5.9 (*) 12.0 - 15.0 g/dL   HCT 16.1 (*) 09.6 - 04.5  %   MCV 78.0  78.0 - 100.0 fL   MCH 22.8 (*) 26.0 - 34.0 pg   MCHC 29.2 (*) 30.0 - 36.0 g/dL   RDW 40.9 (*) 81.1 - 91.4 %   Platelets 298  150 - 400 K/uL   Neutrophils Relative % 84 (*) 43 - 77 %   Neutro Abs 6.2  1.7 - 7.7 K/uL   Lymphocytes Relative 11 (*) 12 - 46 %   Lymphs Abs 0.8  0.7 - 4.0 K/uL   Monocytes Relative 5  3 - 12 %   Monocytes Absolute  0.4  0.1 - 1.0 K/uL   Eosinophils Relative 0  0 - 5 %   Eosinophils Absolute 0.0  0.0 - 0.7 K/uL   Basophils Relative 0  0 - 1 %   Basophils Absolute 0.0  0.0 - 0.1 K/uL  COMPREHENSIVE METABOLIC PANEL      Result Value Ref Range   Sodium 134 (*) 137 - 147 mEq/L   Potassium 3.8  3.7 - 5.3 mEq/L   Chloride 96  96 - 112 mEq/L   CO2 26  19 - 32 mEq/L   Glucose, Bld 109 (*) 70 - 99 mg/dL   BUN 10  6 - 23 mg/dL   Creatinine, Ser 1.190.69  0.50 - 1.10 mg/dL   Calcium 8.5  8.4 - 14.710.5 mg/dL   Total Protein 7.0  6.0 - 8.3 g/dL   Albumin 2.7 (*) 3.5 - 5.2 g/dL   AST 11  0 - 37 U/L   ALT 6  0 - 35 U/L   Alkaline Phosphatase 67  39 - 117 U/L   Total Bilirubin 0.2 (*) 0.3 - 1.2 mg/dL   GFR calc non Af Amer >90  >90 mL/min   GFR calc Af Amer >90  >90 mL/min  URINALYSIS, ROUTINE W REFLEX MICROSCOPIC      Result Value Ref Range   Color, Urine YELLOW  YELLOW   APPearance CLEAR  CLEAR   Specific Gravity, Urine 1.015  1.005 - 1.030   pH 6.0  5.0 - 8.0   Glucose, UA NEGATIVE  NEGATIVE mg/dL   Hgb urine dipstick NEGATIVE  NEGATIVE   Bilirubin Urine NEGATIVE  NEGATIVE   Ketones, ur NEGATIVE  NEGATIVE mg/dL   Protein, ur NEGATIVE  NEGATIVE mg/dL   Urobilinogen, UA 0.2  0.0 - 1.0 mg/dL   Nitrite NEGATIVE  NEGATIVE   Leukocytes, UA NEGATIVE  NEGATIVE  PREGNANCY, URINE      Result Value Ref Range   Preg Test, Ur NEGATIVE  NEGATIVE  PROTIME-INR      Result Value Ref Range   Prothrombin Time 13.5  11.6 - 15.2 seconds   INR 1.05  0.00 - 1.49  CG4 I-STAT (LACTIC ACID)      Result Value Ref Range   Lactic Acid, Venous 1.09  0.5 - 2.2 mmol/L   TYPE AND SCREEN      Result Value Ref Range   ABO/RH(D) O POS     Antibody Screen NEG     Sample Expiration 02/07/2014     Unit Number [id number]     Blood Component Type RBC, LR IRR     Unit division 00     Status of Unit ALLOCATED     Transfusion Status OK TO TRANSFUSE     Crossmatch Result Compatible     Unit Number [id number]     Blood Component Type RBC, LR IRR     Unit division 00     Status of Unit ALLOCATED     Transfusion Status OK TO TRANSFUSE     Crossmatch Result Compatible    PREPARE RBC (CROSSMATCH)      Result Value Ref Range   Order Confirmation ORDER PROCESSED BY BLOOD BANK      Labs Review Labs Reviewed  CBC WITH DIFFERENTIAL - Abnormal; Notable for the following:    RBC 2.59 (*)    Hemoglobin 5.9 (*)    HCT 20.2 (*)    MCH 22.8 (*)    MCHC 29.2 (*)    RDW 18.3 (*)  Neutrophils Relative % 84 (*)    Lymphocytes Relative 11 (*)    All other components within normal limits  COMPREHENSIVE METABOLIC PANEL - Abnormal; Notable for the following:    Sodium 134 (*)    Glucose, Bld 109 (*)    Albumin 2.7 (*)    Total Bilirubin 0.2 (*)    All other components within normal limits  URINALYSIS, ROUTINE W REFLEX MICROSCOPIC  PREGNANCY, URINE  PROTIME-INR  CG4 I-STAT (LACTIC ACID)  TYPE AND SCREEN  PREPARE RBC (CROSSMATCH)   Imaging Review No results found.  EKG Interpretation   None       MDM   Final diagnoses:  Anemia due to chronic blood loss  Ulcerative colitis with rectal bleeding   Medications  sodium chloride 0.9 % bolus 1,000 mL (0 mLs Intravenous Stopped 02/04/14 1758)   Filed Vitals:   02/04/14 1431  BP: 114/77  Pulse: 121  Temp: 98.5 F (36.9 C)  TempSrc: Oral  Resp: 16  SpO2: 100%    Patient presenting to the ED with low Hgb that was taken at her OBGYN's office today. Patient reported that her Hgb was 6.4 at her OBGYN's office. Stated that she has been feeling weak and fatigued. Reported that she has had blood  transfusions x 4 already. Stated that she is due to have colectomy performed on 02/13/2014 for poor UC maintenance.  This provider reviewed the patient's chart. Patient was seen and assessed in the ED back in 11/2013 where she was admitted to the hospital for a Hgb of 5.1. Alert and oriented. Pale. GCS 15. Heart rate elevated, tachycardia -normal rhythm. Radial and DP 2+ bilaterally. Lungs clear to auscultation. BS normoactive in all 4 quadrants - negative pain upon palpation - soft. Negative acute abdomen, negative peritoneal signs. Rectal exam noted mild pink on glove.  Urinalysis negative for infection-negative nitrites and leukocyte ptosis noted. Urine pregnancy negative. Lactic acid negative elevation-1.29. CBC noted low white pus cells of 2.59, hemoglobin 5.9, hematocrit 20.2. CMP noted mildly low sodium of 134. Prothrombin time, INR within normal limits. Hemoglobin noted to be dropped when compared to OB/GYN level of 6.4. Packed red blood cells ordered. This provider spoke with Dr. Ephriam Knuckles, [facility] - patient to be admitted to the hospital for decreased Hgb for transfusion. Patient to be admitted to Telemetry. Patient stable for transfer.   Raymon Mutton, PA-C 02/06/14 1059

## 2014-02-04 NOTE — ED Notes (Signed)
CRITICAL VALUE ALERT  Critical value received:  Hemoglobin 5.9 g/dL Date of notification:  02/04/2014 Time of notification:  1657 Critical value read back:yes Nurse who received alert: Jerilee HohStacy West, RN Primary Nurse notified via Radio: Kai LevinsJeremy Marlos Carmen, RN EDPA notified: Ashok CordiaMarissa (face-to-face) at [phone]

## 2014-02-04 NOTE — H&P (Signed)
[facility] History and Physical  Morgan [name] PYK:[id number] DOB: Feb 20, 1976 DOA: 02/04/2014   PCP: None  Specialists: She is followed by Dr. Koleen [name], gastroenterologist at [facility]. Her general surgeon is Dr. Darcella [name] at [facility] as well. She is followed by [name].  Chief Complaint: Low hemoglobin  HPI: Morgan [name] is a 38 y.o. female with a past medical history of ulcerative colitis with chronic rectal bleeding. She has been transfused numerous times for symptomatic anemia. Patient is scheduled to undergo colectomy on Feb 25th at [facility]. She tells me that her current flareup of ulcerative colitis has been ongoing for the last 5 months. She has about 10 bowel movements on a daily basis. Some of which is loose and some of which is formed. She periodically sees blood in the stool. There has been no worsening of the bleeding in the last couple of weeks. She was last transfused in January at [facility] and she required 2 units of blood. Subsequent to transfusion her hemoglobin was 8.4, and, over a period of 3-4 weeks it dropped to 7.6 and then to 6.4 today at her OB/GYN's office. Hemoglobin was checked because the patient had been having some dizziness/lightheadedness, especially when she would get up suddenly. She has been having some shortness of breath, mainly with exertion. Denied any chest pain, abdominal pain. No nausea, vomiting. Denies any vaginal discharge or bleeding. Denies any fever or chills per se. She was subsequently asked to go to the emergency department for further evaluation of the low hemoglobin. In the ED, she was found to have hemoglobin of 5.9.  Home Medications: Prior to Admission medications   Medication Sig Start Date End Date Taking? Authorizing Provider  acetaminophen (TYLENOL) 500 MG tablet Take 1,000 mg by mouth every 6 (six) hours as needed for moderate pain or fever.    Yes Historical Provider, MD  azaTHIOprine (IMURAN) 50 MG tablet Take 50 mg  by mouth daily.   Yes Historical Provider, MD  mesalamine (LIALDA) 1.2 G EC tablet Take 1,200 mg by mouth 4 (four) times daily.   Yes Historical Provider, MD  Polysaccharide Iron Complex (POLY-IRON 150 PO) Take 1 tablet by mouth 2 (two) times daily.   Yes Historical Provider, MD  predniSONE (DELTASONE) 10 MG tablet Take 20 mg by mouth daily.   Yes Historical Provider, MD  Probiotic Product (PROBIOTIC DAILY PO) Take 1 capsule by mouth daily.   Yes Historical Provider, MD  zolpidem (AMBIEN) 10 MG tablet Take 5-10 mg by mouth at bedtime as needed for sleep.   Yes Historical Provider, MD  Adalimumab (HUMIRA PEN [place]) Inject 1 pen into the skin every 7 (seven) days.     Historical Provider, MD    Allergies:  Allergies  Allergen Reactions  . Budesonide Other (See Comments)    Brain swelling    Past Medical History: Past Medical History  Diagnosis Date  . Ulcerative colitis   . Clostridium difficile infection   . History of blood transfusion     Past Surgical History  Procedure Laterality Date  . Wisdom tooth extraction  1998  . Redo epistomy    . Thyro glossal duct cyst  2010    Social History: She lives with her husband and children. She is an Forensic psychologist. She denies any illicit drug use. No tobacco use. Occasional alcohol consumption. Independent with daily activities.  Family History: Her grandfather was diagnosed with colon cancer.  Review of Systems - History obtained from the patient General ROS: positive  for  - fatigue Psychological ROS: negative Ophthalmic ROS: negative ENT ROS: negative Allergy and Immunology ROS: negative Hematological and Lymphatic ROS: negative Endocrine ROS: negative Respiratory ROS: positive for - SOb with exertion Cardiovascular ROS: negative Gastrointestinal ROS: as in hpi Genito-Urinary ROS: no dysuria, trouble voiding, or hematuria Musculoskeletal ROS: negative Neurological ROS: no TIA or stroke symptoms Dermatological ROS: negative  Physical  Examination  Filed Vitals:   02/04/14 1431  BP: 114/77  Pulse: 121  Temp: 98.5 F (36.9 C)  TempSrc: Oral  Resp: 16  SpO2: 100%    General appearance: alert, cooperative, appears stated age and no distress Head: Normocephalic, without obvious abnormality, atraumatic Eyes: pallor is present Throat: lips, mucosa, and tongue normal; teeth and gums normal Neck: no adenopathy, no carotid bruit, no JVD, supple, symmetrical, trachea midline and thyroid not enlarged, symmetric, no tenderness/mass/nodules Resp: clear to auscultation bilaterally Cardio: S1-S2 is tachycardic. Regular. No S3, S4. No rubs, murmurs, or bruit. No pedal edema. GI: soft, non-tender; bowel sounds normal; no masses,  no organomegaly Extremities: extremities normal, atraumatic, no cyanosis or edema Pulses: 2+ and symmetric Skin: Skin color, texture, turgor normal. No rashes or lesions Neurologic: No focal deficits.  Laboratory Data: Results for orders placed during the hospital encounter of 02/04/14 (from the past 48 hour(s))  CBC WITH DIFFERENTIAL     Status: Abnormal   Collection Time    02/04/14  3:52 PM      Result Value Ref Range   WBC 7.3  4.0 - 10.5 K/uL   RBC 2.59 (*) 3.87 - 5.11 MIL/uL   Hemoglobin 5.9 (*) 12.0 - 15.0 g/dL   Comment: REPEATED TO VERIFY     CRITICAL RESULT CALLED TO, READ BACK BY AND VERIFIED WITH:     S WEST RN 1657 02/04/14 A NAVARRO   HCT 20.2 (*) 36.0 - 46.0 %   MCV 78.0  78.0 - 100.0 fL   MCH 22.8 (*) 26.0 - 34.0 pg   MCHC 29.2 (*) 30.0 - 36.0 g/dL   RDW 18.3 (*) 11.5 - 15.5 %   Platelets 298  150 - 400 K/uL   Neutrophils Relative % 84 (*) 43 - 77 %   Neutro Abs 6.2  1.7 - 7.7 K/uL   Lymphocytes Relative 11 (*) 12 - 46 %   Lymphs Abs 0.8  0.7 - 4.0 K/uL   Monocytes Relative 5  3 - 12 %   Monocytes Absolute 0.4  0.1 - 1.0 K/uL   Eosinophils Relative 0  0 - 5 %   Eosinophils Absolute 0.0  0.0 - 0.7 K/uL   Basophils Relative 0  0 - 1 %   Basophils Absolute 0.0  0.0 - 0.1  K/uL  COMPREHENSIVE METABOLIC PANEL     Status: Abnormal   Collection Time    02/04/14  3:52 PM      Result Value Ref Range   Sodium 134 (*) 137 - 147 mEq/L   Potassium 3.8  3.7 - 5.3 mEq/L   Chloride 96  96 - 112 mEq/L   CO2 26  19 - 32 mEq/L   Glucose, Bld 109 (*) 70 - 99 mg/dL   BUN 10  6 - 23 mg/dL   Creatinine, Ser 0.69  0.50 - 1.10 mg/dL   Calcium 8.5  8.4 - 10.5 mg/dL   Total Protein 7.0  6.0 - 8.3 g/dL   Albumin 2.7 (*) 3.5 - 5.2 g/dL   AST 11  0 - 37 U/L  ALT 6  0 - 35 U/L   Alkaline Phosphatase 67  39 - 117 U/L   Total Bilirubin 0.2 (*) 0.3 - 1.2 mg/dL   GFR calc non Af Amer >90  >90 mL/min   GFR calc Af Amer >90  >90 mL/min   Comment: (NOTE)     The eGFR has been calculated using the CKD EPI equation.     This calculation has not been validated in all clinical situations.     eGFR's persistently <90 mL/min signify possible Chronic Kidney     Disease.  TYPE AND SCREEN     Status: None   Collection Time    02/04/14  3:52 PM      Result Value Ref Range   ABO/RH(D) O POS     Antibody Screen NEG     Sample Expiration 02/07/2014     Unit Number [id number]     Blood Component Type RBC, LR IRR     Unit division 00     Status of Unit ALLOCATED     Transfusion Status OK TO TRANSFUSE     Crossmatch Result Compatible     Unit Number [id number]     Blood Component Type RBC, LR IRR     Unit division 00     Status of Unit ALLOCATED     Transfusion Status OK TO TRANSFUSE     Crossmatch Result Compatible    PROTIME-INR     Status: None   Collection Time    02/04/14  3:52 PM      Result Value Ref Range   Prothrombin Time 13.5  11.6 - 15.2 seconds   INR 1.05  0.00 - 1.49  CG4 I-STAT (LACTIC ACID)     Status: None   Collection Time    02/04/14  4:04 PM      Result Value Ref Range   Lactic Acid, Venous 1.09  0.5 - 2.2 mmol/L  URINALYSIS, ROUTINE W REFLEX MICROSCOPIC     Status: None   Collection Time    02/04/14  4:12 PM      Result Value Ref Range   Color,  Urine YELLOW  YELLOW   APPearance CLEAR  CLEAR   Specific Gravity, Urine 1.015  1.005 - 1.030   pH 6.0  5.0 - 8.0   Glucose, UA NEGATIVE  NEGATIVE mg/dL   Hgb urine dipstick NEGATIVE  NEGATIVE   Bilirubin Urine NEGATIVE  NEGATIVE   Ketones, ur NEGATIVE  NEGATIVE mg/dL   Protein, ur NEGATIVE  NEGATIVE mg/dL   Urobilinogen, UA 0.2  0.0 - 1.0 mg/dL   Nitrite NEGATIVE  NEGATIVE   Leukocytes, UA NEGATIVE  NEGATIVE   Comment: MICROSCOPIC NOT DONE ON URINES WITH NEGATIVE PROTEIN, BLOOD, LEUKOCYTES, NITRITE, OR GLUCOSE <1000 mg/dL.  PREGNANCY, URINE     Status: None   Collection Time    02/04/14  4:12 PM      Result Value Ref Range   Preg Test, Ur NEGATIVE  NEGATIVE   Comment:            THE SENSITIVITY OF THIS     METHODOLOGY IS >20 mIU/mL.  PREPARE RBC (CROSSMATCH)     Status: None   Collection Time    02/04/14  6:00 PM      Result Value Ref Range   Order Confirmation ORDER PROCESSED BY BLOOD BANK      Radiology Reports: No results found.   Problem List  Principal Problem:   Anemia due to  chronic blood loss Active Problems:   History of blood transfusion   H/O Clostridium difficile infection   Ulcerative colitis with rectal bleeding   Assessment: This is a 38 year old female, who has a known history of ulcerative colitis with the chronic rectal bleeding, who presents with the dyspnea and exertion, and was noted to have a low hemoglobin. She has symptomatic anemia. Ulcerative colitis seems to be at her baseline.  Plan: #1 Anemia due to chronic blood loss: At this time she'll be observed in the hospital and will be transfused 2 units of blood. We'll try to get her hemoglobin above 8. She is scheduled for a preoperative evaluation on Wednesday at [facility]. She is scheduled for surgery on the 25th. She is noted to be tachycardic, which is most likely due to the anemia. Blood pressure is stable. Lactic acid is normal.  #2 ulcerative colitis with chronic rectal  bleeding: Her symptoms seem to be at baseline. We did discuss whether to increase the dose of her steroids. However, her surgeon would prefer that she stay at her current dose due to surgery next week and problems associated with healing, when one is on steroid. We will give her one dose of 20 mg tonight and then resume her usual dose from tomorrow. Continue with lialda. Continue with her azathioprine. She took her dose of Humira on Saturday.  #3 history of C. difficile: She has been treated with fecal transplantation in the past and last episode was in 2013. Her diarrhea is at baseline. Do not suspect infectious etiology at this time.  Will check a posttransfusion hemoglobin. Make sure the hemoglobin stays above 8. She may require additional blood tonight. Anticipate that she will be discharged tomorrow after transfusion with a satisfactory hemoglobin so that she can proceed for Pre Surgical evaluation on Wednesday at [facility].  DVT Prophylaxis: SCDs Code Status: Full code Family Communication: Discussed in detail with patient  Disposition Plan: Observe to telemetry   Further management decisions will depend on results of further testing and patient's response to treatment.  [facility]  [facility] Pager [phone]  If 7PM-7AM, please contact night-coverage [email or web address] Password [facility]  02/04/2014, 5:58 PM

## 2014-02-04 NOTE — ED Notes (Signed)
Pt states that [facility] and was told that due to the frequency of her blood transfusions she should have irradiated blood. Spoke with EDPA, which approved of the request. Called lab, laboratory stated it would take an additional 15-20 minutes to prepare the blood.

## 2014-02-04 NOTE — ED Notes (Signed)
Bed: WA15 Expected date:  Expected time:  Means of arrival:  Comments: ems 

## 2014-02-05 LAB — COMPREHENSIVE METABOLIC PANEL
ALT: <5 U/L (ref 0–35)
AST: 9 U/L (ref 0–37)
Albumin: 2.3 g/dL — ABNORMAL LOW (ref 3.5–5.2)
Alkaline Phosphatase: 49 U/L (ref 39–117)
BUN: 5 mg/dL — ABNORMAL LOW (ref 6–23)
CALCIUM: 7.8 mg/dL — AB (ref 8.4–10.5)
CO2: 27 mEq/L (ref 19–32)
CREATININE: 0.53 mg/dL (ref 0.50–1.10)
Chloride: 103 mEq/L (ref 96–112)
GFR calc non Af Amer: >90 mL/min (ref >90)
GLUCOSE: 80 mg/dL (ref 70–99)
Potassium: 3.8 mEq/L (ref 3.7–5.3)
SODIUM: 137 meq/L (ref 137–147)
Total Bilirubin: 0.4 mg/dL (ref 0.3–1.2)
Total Protein: 5.7 g/dL — ABNORMAL LOW (ref 6.0–8.3)

## 2014-02-05 LAB — CBC
HEMATOCRIT: 24 % — AB (ref 36.0–46.0)
HEMOGLOBIN: 7.3 g/dL — AB (ref 12.0–15.0)
MCH: 23.8 pg — ABNORMAL LOW (ref 26.0–34.0)
MCHC: 30.4 g/dL (ref 30.0–36.0)
MCV: 78.2 fL (ref 78.0–100.0)
Platelets: 325 10*3/uL (ref 150–400)
RBC: 3.07 MIL/uL — AB (ref 3.87–5.11)
RDW: 16.3 % — ABNORMAL HIGH (ref 11.5–15.5)
WBC: 5.2 10*3/uL (ref 4.0–10.5)

## 2014-02-05 LAB — HEMOGLOBIN AND HEMATOCRIT, BLOOD
HEMATOCRIT: 30.8 % — AB (ref 36.0–46.0)
HEMOGLOBIN: 9.8 g/dL — AB (ref 12.0–15.0)

## 2014-02-05 LAB — PREPARE RBC (CROSSMATCH)

## 2014-02-05 NOTE — Discharge Summary (Signed)
Physician Discharge Summary  Morgan [name] Scaff WUJ:[id number] DOB: [birth year] DOA: 02/04/2014  PCP: No primary provider on file.  Admit date: 02/04/2014 Discharge date: 02/05/2014  Time spent: 35 minutes  Recommendations for Outpatient Follow-up:  1. [facility] as previously scheduled   Discharge Diagnoses:  Principal Problem:   Anemia due to chronic blood loss Active Problems:   History of blood transfusion   H/O Clostridium difficile infection   Ulcerative colitis with rectal bleeding  Discharge Condition: stable  Diet recommendation: regular  Filed Weights   02/04/14 1840  Weight: 53.978 kg (119 lb)    History of present illness:  Morgan [name] No is a 38 y.o. female with a past medical history of ulcerative colitis with chronic rectal bleeding. She has been transfused numerous times for symptomatic anemia. Patient is scheduled to undergo colectomy on Feb 25th at [facility]. She tells me that her current flareup of ulcerative colitis has been ongoing for the last 5 months. She has about 10 bowel movements on a daily basis. Some of which is loose and some of which is formed. She periodically sees blood in the stool. There has been no worsening of the bleeding in the last couple of weeks. She was last transfused in [facility] at [facility] and she required 2 units of blood. Subsequent to transfusion her hemoglobin was 8.4, and, over a period of 3-4 weeks it dropped to 7.6 and then to 6.4 today at her OB/GYN's office. Hemoglobin was checked because the patient had been having some dizziness/lightheadedness, especially when she would get up suddenly. She has been having some shortness of breath, mainly with exertion. Denied any chest pain, abdominal pain. No nausea, vomiting. Denies any vaginal discharge or bleeding. Denies any fever or chills per se. She was subsequently asked to go to the emergency department for further evaluation of the low hemoglobin. In the ED,  she was found to have hemoglobin of 5.9.  Hospital Course:  Anemia due to chronic blood loss - patient was admitted to the hospital where she was transfused 3 units of blood. Hemoglobin after transfusion was 9.8 from 5.9 on admission. She is scheduled for a preoperative evaluation on Wednesday at [facility]. She is scheduled for surgery on the 25th. Following the transfusion, she was feeling symptomatically better, and ready to be discharged home. Ulcerative colitis with chronic rectal bleeding - her symptoms appear to be at baseline. Continue her home regimen on discharge and no changes were made during this hospitalization. History of C. Difficile - she has been treated with fecal transplantation in the past, last episode 2013. Her diarrhea is at baseline, and currently on suspect infectious etiology.  Procedures:  none   Consultations:  none  Discharge Exam: Filed Vitals:   02/05/14 0842 02/05/14 0910 02/05/14 1010 02/05/14 1054  BP: 113/78 111/73 109/77 113/79  Pulse: 106 101 101 102  Temp: 98.6 F (37 C) 98.4 F (36.9 C) 99 F (37.2 C) 98.7 F (37.1 C)  TempSrc: Oral Oral Oral Oral  Resp: 14 14 16 18   Height:      Weight:      SpO2: 99% 99% 99% 100%   General: NAD Cardiovascular: RRR Respiratory: CTA biL  Discharge Instructions    Medication List         acetaminophen 500 MG tablet  Commonly known as:  TYLENOL  Take 1,000 mg by mouth every 6 (six) hours as needed for moderate pain or fever.  azaTHIOprine 50 MG tablet  Commonly known as:  IMURAN  Take 50 mg by mouth daily.     HUMIRA PEN [place]  Inject 1 pen into the skin every 7 (seven) days.     mesalamine 1.2 G EC tablet  Commonly known as:  LIALDA  Take 1,200 mg by mouth 4 (four) times daily.     POLY-IRON 150 PO  Take 1 tablet by mouth 2 (two) times daily.     predniSONE 10 MG tablet  Commonly known as:  DELTASONE  Take 20 mg by mouth daily.     PROBIOTIC DAILY PO  Take 1 capsule by  mouth daily.     zolpidem 10 MG tablet  Commonly known as:  AMBIEN  Take 5-10 mg by mouth at bedtime as needed for sleep.           Follow-up Information   Follow up with [facility]. Schedule an appointment as soon as possible for a visit in 1 week.      The results of significant diagnostics from this hospitalization (including imaging, microbiology, ancillary and laboratory) are listed below for reference.    Labs: Basic Metabolic Panel:  Recent Labs Lab 02/04/14 1552 02/05/14 0355  NA 134* 137  K 3.8 3.8  CL 96 103  CO2 26 27  GLUCOSE 109* 80  BUN 10 5*  CREATININE 0.69 0.53  CALCIUM 8.5 7.8*   Liver Function Tests:  Recent Labs Lab 02/04/14 1552 02/05/14 0355  AST 11 9  ALT 6 <5  ALKPHOS 67 49  BILITOT 0.2* 0.4  PROT 7.0 5.7*  ALBUMIN 2.7* 2.3*   CBC:  Recent Labs Lab 02/04/14 1552 02/05/14 0355 02/05/14 1248  WBC 7.3 5.2  --   NEUTROABS 6.2  --   --   HGB 5.9* 7.3* 9.8*  HCT 20.2* 24.0* 30.8*  MCV 78.0 78.2  --   PLT 298 325  --    Signed:  GHERGHE, COSTIN  [facility] 02/05/2014, 1:17 PM

## 2014-02-05 NOTE — Discharge Instructions (Signed)
You were cared for by a hospitalist during your hospital stay. If you have any questions about your discharge medications or the care you received while you were in the hospital after you are discharged, you can call the unit and asked to speak with the hospitalist on call if the hospitalist that took care of you is not available. Once you are discharged, your primary care physician will handle any further medical issues. Please note that NO REFILLS for any discharge medications will be authorized once you are discharged, as it is imperative that you return to your primary care physician (or establish a relationship with a primary care physician if you do not have one) for your aftercare needs so that they can reassess your need for medications and monitor your lab values. °  °  °If you do not have a primary care physician, you can call [phone] for a physician referral. ° °Follow with Primary MD in 5-7 days  ° °Get CBC, CMP checked by your doctor and again as further instructed.  °Get a 2 view Chest X ray done next visit if you had Pneumonia of Lung problems at the Hospital. ° °Get Medicines reviewed and adjusted. ° °Please request your Prim.MD to go over all Hospital Tests and Procedure/Radiological results at the follow up, please get all Hospital records sent to your Prim MD by signing hospital release before you go home. ° °Activity: As tolerated with Full fall precautions use walker/cane & assistance as needed ° °Diet: regular ° °For Heart failure patients - Check your Weight same time everyday, if you gain over 2 pounds, or you develop in leg swelling, experience more shortness of breath or chest pain, call your Primary MD immediately. Follow Cardiac Low Salt Diet and 1.8 lit/day fluid restriction. ° °Disposition Home ° °If you experience worsening of your admission symptoms, develop shortness of breath, life threatening emergency, suicidal or homicidal thoughts you must seek medical attention immediately by  calling 911 or calling your MD immediately  if symptoms less severe. ° °You Must read complete instructions/literature along with all the possible adverse reactions/side effects for all the Medicines you take and that have been prescribed to you. Take any new Medicines after you have completely understood and accpet all the possible adverse reactions/side effects.  ° °Do not drive and provide baby sitting services if your were admitted for syncope or siezures until you have seen by Primary MD or a Neurologist and advised to do so again. ° °Do not drive when taking Pain medications.  ° °Do not take more than prescribed Pain, Sleep and Anxiety Medications ° °Special Instructions: If you have smoked or chewed Tobacco  in the last 2 yrs please stop smoking, stop any regular Alcohol  and or any Recreational drug use. ° °Wear Seat belts while driving. ° °

## 2014-02-05 NOTE — Progress Notes (Signed)
UR completed. Patient changed to inpatient-requiring [facility] for anemia

## 2014-02-06 LAB — TYPE AND SCREEN
ABO/RH(D): O POS
ANTIBODY SCREEN: NEGATIVE
Unit division: 0
Unit division: 0
Unit division: 0

## 2014-02-08 NOTE — ED Provider Notes (Signed)
Medical screening examination/treatment/procedure(s) were conducted as a shared visit with non-physician practitioner(s) and myself.  I personally evaluated the patient during the encounter.    Patient seen and examined. Hemodynamically stable. Mild diffuse tenderness but otherwise benign abdomen without peritoneal irritation. Transfusion was being prepared for submission to the hospitalist for further care including transfusion  Rolland PorterMark Dominic Mahaney, MD 02/08/14 [phone]

## 2014-02-13 DIAGNOSIS — Z9049 Acquired absence of other specified parts of digestive tract: Secondary | ICD-10-CM | POA: Insufficient documentation

## 2014-02-15 DIAGNOSIS — D72829 Elevated white blood cell count, unspecified: Secondary | ICD-10-CM | POA: Insufficient documentation

## 2014-02-15 DIAGNOSIS — R000 Tachycardia, unspecified: Secondary | ICD-10-CM | POA: Insufficient documentation

## 2014-02-15 DIAGNOSIS — R778 Other specified abnormalities of plasma proteins: Secondary | ICD-10-CM | POA: Insufficient documentation

## 2014-02-15 DIAGNOSIS — R6510 Systemic inflammatory response syndrome (SIRS) of non-infectious origin without acute organ dysfunction: Secondary | ICD-10-CM | POA: Insufficient documentation

## 2014-02-15 DIAGNOSIS — E8809 Other disorders of plasma-protein metabolism, not elsewhere classified: Secondary | ICD-10-CM | POA: Insufficient documentation

## 2014-02-15 DIAGNOSIS — Z7189 Other specified counseling: Secondary | ICD-10-CM | POA: Insufficient documentation

## 2014-02-16 DIAGNOSIS — Z932 Ileostomy status: Secondary | ICD-10-CM | POA: Insufficient documentation

## 2014-03-01 DIAGNOSIS — IMO0002 Reserved for concepts with insufficient information to code with codable children: Secondary | ICD-10-CM | POA: Insufficient documentation

## 2014-08-15 HISTORY — PX: COLOPROCTECTOMY W/ ILEO J POUCH: SUR277

## 2014-11-23 HISTORY — PX: ILEOSTOMY CLOSURE: SHX1784

## 2014-11-30 DIAGNOSIS — Z932 Ileostomy status: Secondary | ICD-10-CM | POA: Insufficient documentation

## 2016-10-02 ENCOUNTER — Emergency Department (HOSPITAL_COMMUNITY): Payer: Managed Care, Other (non HMO)

## 2016-10-02 ENCOUNTER — Encounter (HOSPITAL_COMMUNITY): Payer: Self-pay | Admitting: Emergency Medicine

## 2016-10-02 ENCOUNTER — Inpatient Hospital Stay (HOSPITAL_COMMUNITY)
Admission: EM | Admit: 2016-10-02 | Discharge: 2016-10-04 | DRG: 389 | Disposition: A | Discharge status: Home / Self Care | Payer: Managed Care, Other (non HMO) | Attending: Emergency Medicine | Admitting: Emergency Medicine

## 2016-10-02 DIAGNOSIS — Z9889 Other specified postprocedural states: Secondary | ICD-10-CM

## 2016-10-02 DIAGNOSIS — K51911 Ulcerative colitis, unspecified with rectal bleeding: Secondary | ICD-10-CM | POA: Diagnosis present

## 2016-10-02 DIAGNOSIS — Z888 Allergy status to other drugs, medicaments and biological substances status: Secondary | ICD-10-CM

## 2016-10-02 DIAGNOSIS — Z9049 Acquired absence of other specified parts of digestive tract: Secondary | ICD-10-CM | POA: Diagnosis not present

## 2016-10-02 DIAGNOSIS — K56609 Unspecified intestinal obstruction, unspecified as to partial versus complete obstruction: Secondary | ICD-10-CM | POA: Diagnosis not present

## 2016-10-02 DIAGNOSIS — Z79899 Other long term (current) drug therapy: Secondary | ICD-10-CM | POA: Diagnosis not present

## 2016-10-02 DIAGNOSIS — Z09 Encounter for follow-up examination after completed treatment for conditions other than malignant neoplasm: Secondary | ICD-10-CM

## 2016-10-02 DIAGNOSIS — K5190 Ulcerative colitis, unspecified, without complications: Secondary | ICD-10-CM

## 2016-10-02 DIAGNOSIS — K56699 Other intestinal obstruction unspecified as to partial versus complete obstruction: Principal | ICD-10-CM | POA: Diagnosis present

## 2016-10-02 DIAGNOSIS — D638 Anemia in other chronic diseases classified elsewhere: Secondary | ICD-10-CM | POA: Diagnosis present

## 2016-10-02 DIAGNOSIS — Z0189 Encounter for other specified special examinations: Secondary | ICD-10-CM

## 2016-10-02 DIAGNOSIS — K529 Noninfective gastroenteritis and colitis, unspecified: Secondary | ICD-10-CM

## 2016-10-02 HISTORY — DX: Ulcerative colitis, unspecified, without complications: K51.90

## 2016-10-02 HISTORY — DX: Anemia in other chronic diseases classified elsewhere: D63.8

## 2016-10-02 HISTORY — DX: Ulcerative colitis, unspecified with rectal bleeding: K51.911

## 2016-10-02 HISTORY — DX: Personal history of other infectious and parasitic diseases: Z86.19

## 2016-10-02 LAB — URINALYSIS, ROUTINE W REFLEX MICROSCOPIC
Bilirubin Urine: NEGATIVE
GLUCOSE, UA: NEGATIVE mg/dL
KETONES UR: 15 mg/dL — AB
Leukocytes, UA: NEGATIVE
Nitrite: NEGATIVE
PH: 6 (ref 5.0–8.0)
PROTEIN: NEGATIVE mg/dL
Specific Gravity, Urine: 1.017 (ref 1.005–1.030)

## 2016-10-02 LAB — URINE MICROSCOPIC-ADD ON

## 2016-10-02 LAB — COMPREHENSIVE METABOLIC PANEL
ALBUMIN: 5.2 g/dL — AB (ref 3.5–5.0)
ALT: 16 U/L (ref 14–54)
AST: 23 U/L (ref 15–41)
Alkaline Phosphatase: 63 U/L (ref 38–126)
Anion gap: 10 (ref 5–15)
BUN: 9 mg/dL (ref 6–20)
CHLORIDE: 101 mmol/L (ref 101–111)
CO2: 25 mmol/L (ref 22–32)
CREATININE: 0.68 mg/dL (ref 0.44–1.00)
Calcium: 10.1 mg/dL (ref 8.9–10.3)
GFR calc Af Amer: >60 mL/min (ref >60)
GFR calc non Af Amer: >60 mL/min (ref >60)
Glucose, Bld: 125 mg/dL — ABNORMAL HIGH (ref 65–99)
Potassium: 3.9 mmol/L (ref 3.5–5.1)
SODIUM: 136 mmol/L (ref 135–145)
Total Bilirubin: 1.2 mg/dL (ref 0.3–1.2)
Total Protein: 9 g/dL — ABNORMAL HIGH (ref 6.5–8.1)

## 2016-10-02 LAB — CBC
HEMATOCRIT: 48.3 % — AB (ref 36.0–46.0)
Hemoglobin: 16.3 g/dL — ABNORMAL HIGH (ref 12.0–15.0)
MCH: 28.5 pg (ref 26.0–34.0)
MCHC: 33.7 g/dL (ref 30.0–36.0)
MCV: 84.4 fL (ref 78.0–100.0)
PLATELETS: 268 10*3/uL (ref 150–400)
RBC: 5.72 MIL/uL — ABNORMAL HIGH (ref 3.87–5.11)
RDW: 13.5 % (ref 11.5–15.5)
WBC: 15.7 10*3/uL — ABNORMAL HIGH (ref 4.0–10.5)

## 2016-10-02 LAB — LIPASE, BLOOD: LIPASE: 38 U/L (ref 11–51)

## 2016-10-02 LAB — PREGNANCY, URINE: PREG TEST UR: NEGATIVE

## 2016-10-02 MED ORDER — PHENOL 1.4 % MT LIQD
2.0000 | OROMUCOSAL | Status: DC | PRN
Start: 2016-10-02 — End: 2016-10-04
  Filled 2016-10-02: qty 177

## 2016-10-02 MED ORDER — METHOCARBAMOL 1000 MG/10ML IJ SOLN
1000.0000 mg | Freq: Four times a day (QID) | INTRAVENOUS | Status: DC | PRN
  Filled 2016-10-02: qty 10

## 2016-10-02 MED ORDER — ALUM & MAG HYDROXIDE-SIMETH 200-200-20 MG/5ML PO SUSP: 30.0000 mL | Freq: Four times a day (QID) | ORAL | Status: DC | PRN

## 2016-10-02 MED ORDER — LACTATED RINGERS IV BOLUS (SEPSIS)
1000.0000 mL | Freq: Once | INTRAVENOUS | Status: AC
  Administered 2016-10-03: 1000 mL via INTRAVENOUS

## 2016-10-02 MED ORDER — DIPHENHYDRAMINE HCL 50 MG/ML IJ SOLN: 12.5000 mg | Freq: Four times a day (QID) | INTRAMUSCULAR | Status: DC | PRN

## 2016-10-02 MED ORDER — PROCHLORPERAZINE EDISYLATE 5 MG/ML IJ SOLN: 5.0000 mg | INTRAMUSCULAR | Status: DC | PRN

## 2016-10-02 MED ORDER — ONDANSETRON HCL 4 MG/2ML IJ SOLN
4.0000 mg | Freq: Four times a day (QID) | INTRAMUSCULAR | Status: DC | PRN
  Administered 2016-10-03: 4 mg via INTRAVENOUS
  Filled 2016-10-02: qty 2

## 2016-10-02 MED ORDER — DIATRIZOATE MEGLUMINE & SODIUM 66-10 % PO SOLN
90.0000 mL | Freq: Once | ORAL | Status: AC
  Administered 2016-10-03: 90 mL via NASOGASTRIC
  Filled 2016-10-02: qty 90

## 2016-10-02 MED ORDER — IOPAMIDOL (ISOVUE-300) INJECTION 61%
100.0000 mL | Freq: Once | INTRAVENOUS | Status: AC | PRN
  Administered 2016-10-02: 100 mL via INTRAVENOUS

## 2016-10-02 MED ORDER — LIP MEDEX EX OINT
1.0000 "application " | TOPICAL_OINTMENT | Freq: Two times a day (BID) | CUTANEOUS | Status: DC
  Administered 2016-10-03 – 2016-10-04 (×4): 1 via TOPICAL
  Filled 2016-10-02 (×2): qty 7

## 2016-10-02 MED ORDER — MAGIC MOUTHWASH
15.0000 mL | Freq: Four times a day (QID) | ORAL | Status: DC | PRN
  Filled 2016-10-02: qty 15

## 2016-10-02 MED ORDER — HYDROMORPHONE HCL 1 MG/ML IJ SOLN
0.5000 mg | INTRAMUSCULAR | Status: DC | PRN
  Administered 2016-10-03 (×5): 1 mg via INTRAVENOUS
  Filled 2016-10-02 (×5): qty 1

## 2016-10-02 MED ORDER — ENOXAPARIN SODIUM 40 MG/0.4ML ~~LOC~~ SOLN
40.0000 mg | SUBCUTANEOUS | Status: DC
  Administered 2016-10-03 – 2016-10-04 (×2): 40 mg via SUBCUTANEOUS
  Filled 2016-10-02 (×2): qty 0.4

## 2016-10-02 MED ORDER — ACETAMINOPHEN 650 MG RE SUPP: 650.0000 mg | Freq: Four times a day (QID) | RECTAL | Status: DC | PRN

## 2016-10-02 MED ORDER — SODIUM CHLORIDE 0.9 % IV SOLN
8.0000 mg | Freq: Four times a day (QID) | INTRAVENOUS | Status: DC | PRN
  Filled 2016-10-02: qty 4

## 2016-10-02 MED ORDER — IOPAMIDOL (ISOVUE-300) INJECTION 61%
30.0000 mL | Freq: Once | INTRAVENOUS | Status: AC | PRN
  Administered 2016-10-02: 30 mL via ORAL

## 2016-10-02 MED ORDER — MENTHOL 3 MG MT LOZG
1.0000 | LOZENGE | OROMUCOSAL | Status: DC | PRN
  Filled 2016-10-02 (×2): qty 9

## 2016-10-02 MED ORDER — HYDRALAZINE HCL 20 MG/ML IJ SOLN: 10.0000 mg | INTRAMUSCULAR | Status: DC | PRN

## 2016-10-02 MED ORDER — SODIUM CHLORIDE 0.9 % IV SOLN
Freq: Once | INTRAVENOUS | Status: AC
  Administered 2016-10-02: 1000 mL via INTRAVENOUS

## 2016-10-02 MED ORDER — ONDANSETRON 8 MG PO TBDP
8.0000 mg | ORAL_TABLET | Freq: Once | ORAL | Status: AC
  Administered 2016-10-02: 8 mg via ORAL
  Filled 2016-10-02: qty 1

## 2016-10-02 MED ORDER — METOPROLOL TARTRATE 5 MG/5ML IV SOLN: 5.0000 mg | Freq: Four times a day (QID) | INTRAVENOUS | Status: DC | PRN

## 2016-10-02 MED ORDER — LACTATED RINGERS IV BOLUS (SEPSIS): 1000.0000 mL | Freq: Three times a day (TID) | INTRAVENOUS | Status: DC | PRN

## 2016-10-02 MED ORDER — LACTATED RINGERS IV SOLN
INTRAVENOUS | Status: DC
  Administered 2016-10-03: 02:00:00 via INTRAVENOUS

## 2016-10-02 NOTE — ED Notes (Signed)
Since patient asked for IV in wrist to be removed due to pain, asked patient if I could stick her again for another IV access for CT with contrast.  Patient request not to be stuck at this time.

## 2016-10-02 NOTE — ED Triage Notes (Addendum)
Pt reports abd pain and swelling that began last night and has gradually gotten worse. Pt reports she feels like she needs to pass gas, but cannot. Threw up 2x prior to arrival. Hx of having colon removed.

## 2016-10-02 NOTE — ED Provider Notes (Signed)
[facility]-EMERGENCY DEPT Provider Note   CSN: [id number] Arrival date & time: 10/02/16  1613     History   Chief Complaint Chief Complaint  Patient presents with  . Emesis  . Abdominal Pain    HPI Morgan [name] is a 40 y.o. female.  She is here for evaluation of vomiting, abdominal pain, and no stool output, for 24 hours. She typically has 6-8 bowel movements daily. No known inciting cause. She denies fever, chills, cough, shortness of breath, weakness or dizziness. No hematemesis. There are no other no modifying factors.  HPI  Past Medical History:  Diagnosis Date  . Clostridium difficile infection   . History of blood transfusion   . Ulcerative colitis [facility])     Patient Active Problem List   Diagnosis Date Noted  . Anemia due to chronic blood loss 02/04/2014  . GI bleed 12/03/2013  . Anemia 12/03/2013  . Hyponatremia 12/03/2013  . Hypokalemia 12/03/2013  . Hyperglycemia 12/03/2013  . H/O Clostridium difficile infection 12/03/2013  . Ulcerative colitis with rectal bleeding (HCC) 12/03/2013  . Ulcerative colitis (HCC)   . History of blood transfusion   . Headache(784.0) 10/01/2012    Past Surgical History:  Procedure Laterality Date  . COLECTOMY    . redo epistomy    . thyro glossal duct cyst  2010  . WISDOM TOOTH EXTRACTION  1998    OB History    No data available       Home Medications    Prior to Admission medications   Medication Sig Start Date End Date Taking? Authorizing Provider  acetaminophen (TYLENOL) 500 MG tablet Take 1,000 mg by mouth every 6 (six) hours as needed for moderate pain or fever.     Historical Provider, MD  Adalimumab (HUMIRA PEN [place]) Inject 1 pen into the skin every 7 (seven) days.     Historical Provider, MD  azaTHIOprine (IMURAN) 50 MG tablet Take 50 mg by mouth daily.    Historical Provider, MD  mesalamine (LIALDA) 1.2 G EC tablet Take 1,200 mg by mouth 4 (four) times daily.    Historical Provider, MD  Polysaccharide Iron  Complex (POLY-IRON 150 PO) Take 1 tablet by mouth 2 (two) times daily.    Historical Provider, MD  predniSONE (DELTASONE) 10 MG tablet Take 20 mg by mouth daily.    Historical Provider, MD  Probiotic Product (PROBIOTIC DAILY PO) Take 1 capsule by mouth daily.    Historical Provider, MD  zolpidem (AMBIEN) 10 MG tablet Take 5-10 mg by mouth at bedtime as needed for sleep.    Historical Provider, MD    Family History History reviewed. No pertinent family history.  Social History Social History  Substance Use Topics  . Smoking status: Never Smoker  . Smokeless tobacco: Never Used  . Alcohol use Yes     Comment: occ     Allergies   Budesonide   Review of Systems Review of Systems  All other systems reviewed and are negative.    Physical Exam Updated Vital Signs BP 135/94 (BP Location: Right Arm)   Pulse 101   Temp 97.8 F (36.6 C) (Oral)   Resp 18   Ht 5\' 11"  (1.803 m)   Wt 168 lb (76.2 kg)   SpO2 100%   BMI 23.43 kg/m   Physical Exam  Constitutional: She is oriented to person, place, and time. She appears well-developed and well-nourished.  HENT:  Head: Normocephalic and atraumatic.  Eyes: Conjunctivae and EOM are normal. Pupils are  equal, round, and reactive to light.  Neck: Normal range of motion and phonation normal. Neck supple.  Cardiovascular: Normal rate and regular rhythm.   Pulmonary/Chest: Effort normal and breath sounds normal. She exhibits no tenderness.  Abdominal: Soft. She exhibits no distension and no mass. There is tenderness (Moderate right and mid abdominal tenderness.). There is guarding (Right abdomen). There is no rebound.  Hypoactive bowel sounds  Musculoskeletal: Normal range of motion.  Neurological: She is alert and oriented to person, place, and time. She exhibits normal muscle tone.  Skin: Skin is warm and dry.  Psychiatric: She has a normal mood and affect. Her behavior is normal. Judgment and thought content normal.  Nursing note and  vitals reviewed.    ED Treatments / Results  Labs (all labs ordered are listed, but only abnormal results are displayed) Labs Reviewed  CBC - Abnormal; Notable for the following:       Result Value   WBC 15.7 (*)    RBC 5.72 (*)    Hemoglobin 16.3 (*)    HCT 48.3 (*)    All other components within normal limits  LIPASE, BLOOD  COMPREHENSIVE METABOLIC PANEL  URINALYSIS, ROUTINE W REFLEX MICROSCOPIC (NOT AT [facility])    EKG  EKG Interpretation None       Radiology No results found.  Procedures Procedures (including critical care time)  Medications Ordered in ED Medications  ondansetron (ZOFRAN-ODT) disintegrating tablet 8 mg (8 mg Oral Given 10/02/16 1705)     Initial Impression / Assessment and Plan / ED Course  I have reviewed the triage vital signs and the nursing notes.  Pertinent labs & imaging results that were available during my care of the patient were reviewed by me and considered in my medical decision making (see chart for details).  Clinical Course    Medications  ondansetron (ZOFRAN-ODT) disintegrating tablet 8 mg (8 mg Oral Given 10/02/16 1705)  iopamidol (ISOVUE-300) 61 % injection 100 mL (100 mLs Intravenous Contrast Given 10/02/16 2059)  iopamidol (ISOVUE-300) 61 % injection 30 mL (30 mLs Oral Contrast Given 10/02/16 2059)  0.9 %  sodium chloride infusion (1,000 mLs Intravenous New Bag/Given 10/02/16 2210)    Patient Vitals for the past 24 hrs:  BP Temp Temp src Pulse Resp SpO2 Height Weight  10/02/16 2158 131/79 - - 99 16 99 % - -  10/02/16 1936 125/94 - - 85 16 100 % - -  10/02/16 1620 - - - - - - 5\' 11"  (1.803 m) 168 lb (76.2 kg)  10/02/16 1619 135/94 97.8 F (36.6 C) Oral 101 18 100 % - -    22:28- case discussed with surgery, Dr. Michaell Cowing, who asked if the patient was secured here versus at the facility that treated her for colectomy. I asked the patient and she desires to stay here. I ordered an NG tube. I will ask Dr. 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  23:15- Dr. Michaell Cowing will admit.  10:29 PM Reevaluation with update and discussion. After initial assessment and treatment, an updated evaluation reveals she is somewhat more comfortable now, and states that she has "passed a little gas". Brysin Towery L    Final Clinical Impressions(s) / ED Diagnoses   Final diagnoses:  SBO (small bowel obstruction)    Mechanical small bowel obstruction. Nontoxic patient. She will require admission for observation, with all rest and NG drainage.  Nursing Notes Reviewed/ Care Coordinated Applicable Imaging Reviewed Interpretation of  Laboratory Data incorporated into ED treatment   New Prescriptions New Prescriptions   No medications on file     Mancel BaleElliott Tahj Njoku, MD 10/02/16 2339

## 2016-10-02 NOTE — H&P (Addendum)
[place]  [address] [place], [place] [address] Phone: [phone] FAX: [phone]     Morgan [name]  Oct 30, 1976 [id number]  CARE TEAM:  PCP: [name]  Outpatient Care Team: Patient Care Team: [facility] as PCP - General [name], MD as Consulting Physician (Gastroenterology) [name], MD as Consulting Physician (Colon and Rectal Surgery)  Inpatient Treatment Team: Treatment Team: Attending Provider: [name], MD; Registered Nurse: [name], RN; Technician: [name], EMT; Consulting Physician: [name], MD  This patient is a 40 y.o.female who presents today for surgical evaluation at the request of Dr [name].   Reason for evaluation: SBO  Active woman with ulcerative colitis.  Comes today with her husband.  Had worsening bleeding and inflammation.  Required urgent colectomy for large thousand 15.  Head completion proctocolectomy with ileal pouch anal anastomosis and diverting loop ileostomy in August.  Had ileostomy takedown December 2015.  Pathology CONSISTENT with ulcerative colitis.  No prior history of Crohn's.  This was all done at [facility].  Last seen by Dr. Lenise Arena in January 2017 without any complaints of rectal bleeding, poorly controlled diarrhea, nor pouchitis.  Normally moves her bowels about 6-8 times a day.  Usually smaller early in the day and more formed at the end of the day.  No longer on antidiarrheals.  Patient noticed worsening bloating with nausea and vomiting.  Crease abdominal discomfort, especially right lower abdomen. Obstipated for more than 24 hours.  Was concerned.  Came to [facility] emergency department.  History physical and CT scan suspicious for small bowel obstruction with transition zone and right lower abdomen which correlates with her discomfort.  Surgical consultation requested.  Patient declined to go back to [facility] and wished to stay here.  Patient's nausea is LAD with the help medicines.  She does have chronic anemia for which she is been taking a lot of spinach and iron supplements.  Last hemoglobin was up to 11 a few months ago as opposed to a last year when in was still in the 8s.  She is very physically active and is in an exercising group.  Energy level otherwise pretty good.  No bad bouts of rectal bleeding.  No sick contacts.  No travel history.  No heartburn or reflux.  No history of upper GI issues or ulcers.  Past Medical History:  Diagnosis Date  . Anemia of chronic disease 12/03/2013  . Clostridium difficile infection   . GI bleed 12/03/2013  . H/O Clostridium difficile infection 12/03/2013  . History of blood transfusion   . Ulcerative colitis ([place])   . Ulcerative colitis s/p colectomy & J pouch 2015 10/02/2016   [facility] Dr [name] Stall  . Ulcerative colitis with rectal bleeding s/p colectomy 2015 12/03/2013    Past Surgical History:  Procedure Laterality Date  . COLOPROCTECTOMY W/ ILEO J POUCH  08/15/2014   Dr [name] Stall [facility]: medical refractory ulcerative colitis s/p total proctocolectomy with ileal J pouch   . ILEOSTOMY CLOSURE  11/23/2014   Dr [name] Stall, [facility]  . redo epistomy    . SUBTOTAL COLECTOMY  01/2014   Dr [name] Stall, [facility]  . thyro glossal duct cyst  2010  . [place] EXTRACTION  1998    Social History   Social History  . Marital status: Married    Spouse name: N/A  . Number of children: N/A  . Years of education: N/A  Occupational History  . Not on file.   Social History Main Topics  . Smoking status: Never Smoker  . Smokeless tobacco: Never Used  . Alcohol use Yes     Comment: occ  . Drug use: No  . Sexual activity: Not on file   Other Topics Concern  . Not on file   Social History Narrative  . No narrative on file    History reviewed. No pertinent family history.  Current Facility-Administered Medications  Medication Dose Route Frequency Provider  Last Rate Last Dose  . acetaminophen (TYLENOL) suppository 650 mg  650 mg Rectal Q6H PRN Michael Boston, MD      . alum & mag hydroxide-simeth (MAALOX/MYLANTA) 200-200-20 MG/5ML suspension 30 mL  30 mL Oral Q6H PRN Michael Boston, MD      . diatrizoate meglumine-sodium (GASTROGRAFIN) 66-10 % solution 90 mL  90 mL Per NG tube Once Michael Boston, MD      . diphenhydrAMINE (BENADRYL) injection 12.5-25 mg  12.5-25 mg Intravenous Q6H PRN Michael Boston, MD      . enoxaparin (LOVENOX) injection 40 mg  40 mg Subcutaneous Q24H Michael Boston, MD      . hydrALAZINE (APRESOLINE) injection 10 mg  10 mg Intravenous Q2H PRN Michael Boston, MD      . HYDROmorphone (DILAUDID) injection 0.5-2 mg  0.5-2 mg Intravenous Q2H PRN Michael Boston, MD      . lactated ringers bolus 1,000 mL  1,000 mL Intravenous Once Michael Boston, MD      . lactated ringers bolus 1,000 mL  1,000 mL Intravenous Q8H PRN Michael Boston, MD      . lactated ringers infusion   Intravenous Continuous Michael Boston, MD      . lip balm (CARMEX) ointment 1 application  1 application Topical BID Michael Boston, MD      . magic mouthwash  15 mL Oral QID PRN Michael Boston, MD      . menthol-cetylpyridinium (CEPACOL) lozenge 3 mg  1 lozenge Oral PRN Michael Boston, MD      . methocarbamol (ROBAXIN) 1,000 mg in dextrose 5 % 50 mL IVPB  1,000 mg Intravenous Q6H PRN Michael Boston, MD      . metoprolol (LOPRESSOR) injection 5 mg  5 mg Intravenous Q6H PRN Michael Boston, MD      . ondansetron [facility]) injection 4 mg  4 mg Intravenous Q6H PRN Michael Boston, MD       Or  . ondansetron (ZOFRAN) 8 mg in sodium chloride 0.9 % 50 mL IVPB  8 mg Intravenous Q6H PRN Michael Boston, MD      . phenol (CHLORASEPTIC) mouth spray 2 spray  2 spray Mouth/Throat PRN Michael Boston, MD      . prochlorperazine (COMPAZINE) injection 5-10 mg  5-10 mg Intravenous Q4H PRN Michael Boston, MD       Current Outpatient Prescriptions  Medication Sig Dispense Refill  . bisacodyl (DULCOLAX) 5 MG EC tablet Take 5  mg by mouth daily as needed for moderate constipation.    Marland Kitchen zolpidem (AMBIEN) 10 MG tablet Take 10 mg by mouth at bedtime as needed for sleep.        Allergies  Allergen Reactions  . Budesonide Other (See Comments)    Brain swelling    ROS: Constitutional:  No fevers, chills, sweats.  Weight stable Eyes:  No vision changes, No discharge HENT:  No sore throats, nasal drainage Lymph: No neck swelling, No bruising easily Pulmonary:  No cough, productive sputum  CV: No orthopnea, PND  Patient walks 60 minutes for about 2 miles without difficulty.  No exertional chest/neck/shoulder/arm pain. GI:  UC as noted above.  No personal nor family history of GI/colon cancer, allergy such as Celiac Sprue, dietary/dairy problems, colitis, ulcers nor gastritis.  No recent sick contacts/gastroenteritis.  No travel outside the country.  No changes in diet. Renal: No UTIs, No hematuria Genital:  No drainage, bleeding, masses Musculoskeletal: No severe joint pain.  Good ROM major joints Skin:  No sores or lesions.  No rashes Heme/Lymph:  No easy bleeding.  No swollen lymph nodes Neuro: No focal weakness/numbness.  No seizures Psych: No suicidal ideation.  No hallucinations  BP 131/79   Pulse 99   Temp 97.8 F (36.6 C) (Oral)   Resp 16   Ht _0  (1.803 m)   Wt 76.2 kg (168 lb)   SpO2 99%   BMI 23.43 kg/m   Physical Exam: General: Pt awake/alert/oriented x4 in no major acute distress Eyes: PERRL, normal EOM. Sclera nonicteric Neuro: CN II-XII intact w/o focal sensory/motor deficits. Lymph: No head/neck/groin lymphadenopathy Psych:  No delerium/psychosis/paranoia HENT: Normocephalic, Mucus membranes moist.  No thrush Neck: Supple, No tracheal deviation Chest: No pain.  Good respiratory excursion. CV:  Pulses intact.  Regular rhythm Abdomen: Soft, Nondistended.  Mild abdominal discomfort and right lower quadrant.  No guarding.  No rales headache.  No peritonitis.  No umbilical or incisional  hernias. Ext:  SCDs BLE.  No significant edema.  No cyanosis Skin: No petechiae / purpurea.  No major sores Musculoskeletal: No severe joint pain.  Good ROM major joints   Results:   Labs: Results for orders placed or performed during the hospital encounter of 10/02/16 (from the past 48 hour(s))  Lipase, blood     Status: None   Collection Time: 10/02/16  5:20 PM  Result Value Ref Range   Lipase 38 11 - 51 U/L  Comprehensive metabolic panel     Status: Abnormal   Collection Time: 10/02/16  5:20 PM  Result Value Ref Range   Sodium 136 135 - 145 mmol/L   Potassium 3.9 3.5 - 5.1 mmol/L   Chloride 101 101 - 111 mmol/L   CO2 25 22 - 32 mmol/L   Glucose, Bld 125 (H) 65 - 99 mg/dL   BUN 9 6 - 20 mg/dL   Creatinine, Ser 0.68 0.44 - 1.00 mg/dL   Calcium 10.1 8.9 - 10.3 mg/dL   Total Protein 9.0 (H) 6.5 - 8.1 g/dL   Albumin 5.2 (H) 3.5 - 5.0 g/dL   AST 23 15 - 41 U/L   ALT 16 14 - 54 U/L   Alkaline Phosphatase 63 38 - 126 U/L   Total Bilirubin 1.2 0.3 - 1.2 mg/dL   GFR calc non Af Amer >60 >60 mL/min   GFR calc Af Amer >60 >60 mL/min    Comment: (NOTE) The eGFR has been calculated using the CKD EPI equation. This calculation has not been validated in all clinical situations. eGFR's persistently <60 mL/min signify possible Chronic Kidney Disease.    Anion gap 10 5 - 15  CBC     Status: Abnormal   Collection Time: 10/02/16  5:20 PM  Result Value Ref Range   WBC 15.7 (H) 4.0 - 10.5 K/uL   RBC 5.72 (H) 3.87 - 5.11 MIL/uL   Hemoglobin 16.3 (H) 12.0 - 15.0 g/dL   HCT 48.3 (H) 36.0 - 46.0 %   MCV 84.4 78.0 - 100.0  fL   MCH 28.5 26.0 - 34.0 pg   MCHC 33.7 30.0 - 36.0 g/dL   RDW 13.5 11.5 - 15.5 %   Platelets 268 150 - 400 K/uL  Urinalysis, Routine w reflex microscopic     Status: Abnormal   Collection Time: 10/02/16  5:25 PM  Result Value Ref Range   Color, Urine YELLOW YELLOW   APPearance CLEAR CLEAR    Comment: CORRECTED ON 10/14 AT 1808: PREVIOUSLY REPORTED AS CLOUDY    Specific Gravity, Urine 1.017 1.005 - 1.030   pH 6.0 5.0 - 8.0   Glucose, UA NEGATIVE NEGATIVE mg/dL   Hgb urine dipstick TRACE (A) NEGATIVE   Bilirubin Urine NEGATIVE NEGATIVE   Ketones, ur 15 (A) NEGATIVE mg/dL   Protein, ur NEGATIVE NEGATIVE mg/dL   Nitrite NEGATIVE NEGATIVE   Leukocytes, UA NEGATIVE NEGATIVE  Urine microscopic-add on     Status: Abnormal   Collection Time: 10/02/16  5:25 PM  Result Value Ref Range   Squamous Epithelial / LPF 6-30 (A) NONE SEEN   WBC, UA 0-5 0 - 5 WBC/hpf   RBC / HPF 0-5 0 - 5 RBC/hpf   Bacteria, UA FEW (A) NONE SEEN   Urine-Other MUCOUS PRESENT   Pregnancy, urine     Status: None   Collection Time: 10/02/16  5:25 PM  Result Value Ref Range   Preg Test, Ur NEGATIVE NEGATIVE    Comment:        THE SENSITIVITY OF THIS METHODOLOGY IS >20 mIU/mL.     Imaging / Studies: Ct Abdomen Pelvis W Contrast  Result Date: 10/02/2016 CLINICAL DATA:  Abdominal pain and vomiting. Status post colectomy. History of ulcerative colitis. EXAM: CT ABDOMEN AND PELVIS WITH CONTRAST TECHNIQUE: Multidetector CT imaging of the abdomen and pelvis was performed using the standard protocol following bolus administration of intravenous contrast. CONTRAST:  76m ISOVUE-300 IOPAMIDOL (ISOVUE-300) INJECTION 61%, 1026mISOVUE-300 IOPAMIDOL (ISOVUE-300) INJECTION 61% COMPARISON:  None. FINDINGS: Lower chest:  No contributory findings. Hepatobiliary: No focal liver abnormality.No evidence of biliary obstruction or stone. Pancreas: Unremarkable. Spleen: Unremarkable. Adrenals/Urinary Tract: Negative adrenals. Tiny bilateral renal cysts. No hydronephrosis or urolithiasis. Unremarkable bladder. Stomach/Bowel: Small bowel gradually dilates and oral contrast gradually dilutes. There are 2 possible transition points in the right lower quadrant, most likely 2:69, less likely 2:54. Beyond these narrowings there is relative decompression of small bowel, although fluid is still present within the  distal loops and ileal pouch. Small bowel loops in the right lower quadrant occasionally appears thickened, and there is prominence of ileal mesenteric nodes. There is nonspecific mesenteric congestion and trace peritoneal fluid, greatest in the right abdomen. No evidence of perforation or abscess. Vascular/Lymphatic: No acute vascular abnormality. Reproductive:Ovoid fluid collections of the bilateral adnexae could be paraovarian cyst or peritoneal inclusion cyst. Other: No ascites or pneumoperitoneum. Musculoskeletal: No acute or aggressive finding.  No sacroiliitis. IMPRESSION: 1. Partial distal small bowel obstruction with transition in the right lower quadrant. Some bowel loops appear thickened in the right lower quadrant, with mild mesenteric adenopathy, possible underlying enteritis. 2. Total colectomy with ileal pouch. Electronically Signed   By: JoMonte Fantasia.D.   On: 10/02/2016 21:37    Medications / Allergies: per chart  Antibiotics: Anti-infectives    None      Assessment  Morgan [name]  [age].o. female       Problem List:  Principal Problem:   SBO (small bowel obstruction) Active Problems:   Anemia of chronic disease  Ulcerative colitis with rectal bleeding s/p colectomy 2015   Ulcerative colitis s/p colectomy & J pouch 2015   Small bowel obstruction with transition zone and right lower quadrant inpatient with three surgeries for ulcerative colitis.  No evidence of pouchitis or recurrent bleeding or other issues in the past two years.  Plan: Admit.  IV fluids.  Small bowel protocol.  Nasogastric tube.  Serial x-rays and examinations.  Hopefully this will turn around quickly.  If she does not improve or if she worsens, may require abdominal exploration with lysis of adhesions.  I think she wanted to try and negotiate treatment and see if she can avoid nasogastric tube or other interventions.  I would prefer that she take our advice and followed the small bowel protocol  so that we did not delay treatment nor miss any potential need for further intervention.  Follow-up on anemia.  Hemoglobin 16 falsely elevated as a sign of dehydration.  She is not in kidney failure.  Hopefully, with rehydration she will feel better.  Some small bowel thickening is subtle at best and most likely related to the obstruction.  Could consider a follow-up evaluation with her gastrologist/colorectal surgeon.  She has never had a diagnosis of Crohns.  I would think she would present with pouchitis or some other problem first.  Does not seem to be the cause of any obstruction at this point.  Low threshold to have follow-up studies and outpatient follow-up.  -VTE prophylaxis- SCDs, etc  -mobilize as tolerated to help recovery  I updated the patient's status to the patient, spouse, and nurse.  D/w Dr Jerene Canny ED MD.  Recommendations were made.  Questions were answered.  They expressed understanding & appreciation.    Adin Hector, M.D., F.A.C.S. Gastrointestinal and Minimally Invasive Surgery [facility] [address], [place], [place] [address] [phone] Main / Paging   10/02/2016  Note: Portions of this report may have been transcribed using voice recognition software. Every effort was made to ensure accuracy; however, inadvertent computerized transcription errors may be present.   Any transcriptional errors that result from this process are unintentional.

## 2016-10-03 ENCOUNTER — Inpatient Hospital Stay (HOSPITAL_COMMUNITY): Payer: Managed Care, Other (non HMO)

## 2016-10-03 LAB — CREATININE, SERUM: Creatinine, Ser: 0.64 mg/dL (ref 0.44–1.00)

## 2016-10-03 LAB — MAGNESIUM: Magnesium: 2.2 mg/dL (ref 1.7–2.4)

## 2016-10-03 LAB — POTASSIUM: Potassium: 3.6 mmol/L (ref 3.5–5.1)

## 2016-10-03 LAB — HEMOGLOBIN: HEMOGLOBIN: 14.3 g/dL (ref 12.0–15.0)

## 2016-10-03 MED ORDER — KCL IN DEXTROSE-NACL 20-5-0.9 MEQ/L-%-% IV SOLN
INTRAVENOUS | Status: DC
  Administered 2016-10-03 – 2016-10-04 (×3): via INTRAVENOUS
  Filled 2016-10-03 (×4): qty 1000

## 2016-10-03 MED ORDER — ONDANSETRON HCL 4 MG/2ML IJ SOLN
4.0000 mg | INTRAMUSCULAR | Status: DC | PRN
  Administered 2016-10-03: 4 mg via INTRAVENOUS

## 2016-10-03 MED ORDER — ORAL CARE MOUTH RINSE
15.0000 mL | Freq: Two times a day (BID) | OROMUCOSAL | Status: DC
  Administered 2016-10-03 (×2): 15 mL via OROMUCOSAL

## 2016-10-03 MED ORDER — ONDANSETRON HCL 4 MG/2ML IJ SOLN
4.0000 mg | Freq: Four times a day (QID) | INTRAMUSCULAR | Status: DC | PRN
  Filled 2016-10-03: qty 2

## 2016-10-03 NOTE — Progress Notes (Signed)
Received pt from ED via stretcher.  Gastrograffin inserted through NG tube, clamped and radiology notified

## 2016-10-03 NOTE — Progress Notes (Signed)
Subjective: Complains of nausea. Spitting up clear liquid. She says she is passing some flatus  Objective: Vital signs in last 24 hours: Temp:  [97.7 F (36.5 C)-98.1 F (36.7 C)] 98.1 F (36.7 C) (10/15 0627) Pulse Rate:  [85-101] 96 (10/15 0627) Resp:  [16-20] 18 (10/15 0627) BP: (125-136)/(79-102) 136/102 (10/15 0627) SpO2:  [97 %-100 %] 100 % (10/15 0627) Weight:  [76.2 kg (168 lb)] 76.2 kg (168 lb) (10/14 1620) Last BM Date: 10/02/16  Intake/Output from previous day: 10/14 0701 - 10/15 0700 In: 725.8 [I.V.:725.8] Out: 100 [Emesis/NG output:100] Intake/Output this shift: Total I/O In: -  Out: 200 [Urine:200]  Resp: clear to auscultation bilaterally Cardio: regular rate and rhythm GI: soft, minimal tenderness. no distension  Lab Results:   Recent Labs  10/02/16 1720 10/03/16 0411  WBC 15.7*  --   HGB 16.3* 14.3  HCT 48.3*  --   PLT 268  --    BMET  Recent Labs  10/02/16 1720 10/03/16 0411  NA 136  --   K 3.9 3.6  CL 101  --   CO2 25  --   GLUCOSE 125*  --   BUN 9  --   CREATININE 0.68 0.64  CALCIUM 10.1  --    PT/INR No results for input(s): LABPROT, INR in the last 72 hours. ABG No results for input(s): PHART, HCO3 in the last 72 hours.  Invalid input(s): PCO2, PO2  Studies/Results: Ct Abdomen Pelvis W Contrast  Result Date: 10/02/2016 CLINICAL DATA:  Abdominal pain and vomiting. Status post colectomy. History of ulcerative colitis. EXAM: CT ABDOMEN AND PELVIS WITH CONTRAST TECHNIQUE: Multidetector CT imaging of the abdomen and pelvis was performed using the standard protocol following bolus administration of intravenous contrast. CONTRAST:  30mL ISOVUE-300 IOPAMIDOL (ISOVUE-300) INJECTION 61%, ISOVUE-300 IOPAMIDOL (ISOVUE-300) INJECTION 61% COMPARISON:  None. FINDINGS: Lower chest:  No contributory findings. Hepatobiliary: No focal liver abnormality.No evidence of biliary obstruction or stone. Pancreas: Unremarkable. Spleen:  Unremarkable. Adrenals/Urinary Tract: Negative adrenals. Tiny bilateral renal cysts. No hydronephrosis or urolithiasis. Unremarkable bladder. Stomach/Bowel: Small bowel gradually dilates and oral contrast gradually dilutes. There are 2 possible transition points in the right lower quadrant, most likely 2:69, less likely 2:54. Beyond these narrowings there is relative decompression of small bowel, although fluid is still present within the distal loops and ileal pouch. Small bowel loops in the right lower quadrant occasionally appears thickened, and there is prominence of ileal mesenteric nodes. There is nonspecific mesenteric congestion and trace peritoneal fluid, greatest in the right abdomen. No evidence of perforation or abscess. Vascular/Lymphatic: No acute vascular abnormality. Reproductive:Ovoid fluid collections of the bilateral adnexae could be paraovarian cyst or peritoneal inclusion cyst. Other: No ascites or pneumoperitoneum. Musculoskeletal: No acute or aggressive finding.  No sacroiliitis. IMPRESSION: 1. Partial distal small bowel obstruction with transition in the right lower quadrant. Some bowel loops appear thickened in the right lower quadrant, with mild mesenteric adenopathy, possible underlying enteritis. 2. Total colectomy with ileal pouch. Electronically Signed   By: Marnee Spring M.D.   On: 10/02/2016 21:37   Dg Abd Portable 1v-small Bowel Protocol-position Verification  Result Date: 10/03/2016 CLINICAL DATA:  NG tube placement. EXAM: PORTABLE ABDOMEN - 1 VIEW COMPARISON:  CT abdomen/pelvis yesterday FINDINGS: Tip and side port of the enteric tube below the diaphragm in the stomach. Small bowel dilatation was better appreciated on CT. There is excreted intravenous contrast within the renal collecting systems. IMPRESSION: Tip and side port of the enteric tube below the  diaphragm in the stomach. Electronically Signed   By: Rubye OaksMelanie  Ehinger M.D.   On: 10/03/2016 01:11     Anti-infectives: Anti-infectives    None      Assessment/Plan: s/p * No surgery found * Continue ng and bowel rest. small bowel protocol  Ambulate Check abdominal xrays today  LOS: 1 day    TOTH III,PAUL S 10/03/2016

## 2016-10-03 NOTE — Progress Notes (Signed)
NG tube was advanced 20cm per order. Pt tolerated well.  Morgan [name]

## 2016-10-04 ENCOUNTER — Inpatient Hospital Stay (HOSPITAL_COMMUNITY): Payer: Managed Care, Other (non HMO)

## 2016-10-04 NOTE — Discharge Instructions (Signed)
Low-Fiber Diet °Fiber is found in fruits, vegetables, and whole grains. A low-fiber diet restricts fibrous foods that are not digested in the small intestine. A diet containing about 10-15 grams of fiber per day is considered low fiber. Low-fiber diets may be used to: °· Promote healing and rest the bowel during intestinal flare-ups. °· Prevent blockage of a partially obstructed or narrowed gastrointestinal tract. °· Reduce fecal weight and volume. °· Slow the movement of feces. °You may be on a low-fiber diet as a transitional diet following surgery, after an injury (trauma), or because of a short (acute) or lifelong (chronic) illness. Your health care provider will determine the length of time you need to stay on this diet.  °WHAT DO I NEED TO KNOW ABOUT A LOW-FIBER DIET? °Always check the fiber content on the packaging's Nutrition Facts label, especially on foods from the grains list. Ask your dietitian if you have questions about specific foods that are related to your condition, especially if the food is not listed below. In general, a low-fiber food will have less than 2 g of fiber. °WHAT FOODS CAN I EAT? °Grains °All breads and crackers made with white flour. Sweet rolls, doughnuts, waffles, pancakes, French toast, bagels. Pretzels, Melba toast, zwieback. Well-cooked cereals, such as cornmeal, farina, or cream cereals. Dry cereals that do not contain whole grains, fruit, or nuts, such as refined corn, wheat, rice, and oat cereals. Potatoes prepared any way without skins, plain pastas and noodles, refined white rice. Use white flour for baking and making sauces. Use allowed list of grains for casseroles, dumplings, and puddings.  °Vegetables °Strained tomato and vegetable juices. Fresh lettuce, cucumber, spinach. Well-cooked (no skin or pulp) or canned vegetables, such as asparagus, bean sprouts, beets, carrots, green beans, mushrooms, potatoes, pumpkin, spinach, yellow squash, tomato sauce/puree, turnips,  yams, and zucchini. Keep servings limited to ½ cup.  °Fruits °All fruit juices except prune juice. Cooked or canned fruits without skin and seeds, such as applesauce, apricots, cherries, fruit cocktail, grapefruit, grapes, mandarin oranges, melons, peaches, pears, pineapple, and plums. Fresh fruits without skin, such as apricots, avocados, bananas, melons, pineapple, nectarines, and peaches. Keep servings limited to ½ cup or 1 piece.   °Meat and Other Protein Sources °Ground or well-cooked tender beef, ham, veal, lamb, pork, or poultry. Eggs, plain cheese. Fish, oysters, shrimp, lobster, and other seafood. Liver, organ meats. Smooth nut butters. °Dairy °All milk products and alternative dairy substitutes, such as soy, rice, almond, and coconut, not containing added whole nuts, seeds, or added fruit. °Beverages °Decaf coffee, fruit, and vegetable juices or smoothies (small amounts, with no pulp or skins, and with fruits from allowed list), sports drinks, herbal tea. °Condiments °Ketchup, mustard, vinegar, cream sauce, cheese sauce, cocoa powder. Spices in moderation, such as allspice, basil, bay leaves, celery powder or leaves, cinnamon, cumin powder, curry powder, ginger, mace, marjoram, onion or garlic powder, oregano, paprika, parsley flakes, ground pepper, rosemary, sage, savory, tarragon, thyme, and turmeric. °Sweets and Desserts °Plain cakes and cookies, pie made with allowed fruit, pudding, custard, cream pie. Gelatin, fruit, ice, sherbet, frozen ice pops. Ice cream, ice milk without nuts. Plain hard candy, honey, jelly, molasses, syrup, sugar, chocolate syrup, gumdrops, marshmallows. Limit overall sugar intake.  °Fats and Oil °Margarine, butter, cream, mayonnaise, salad oils, plain salad dressings made from allowed foods. Choose healthy fats such as olive oil, canola oil, and omega-3 fatty acids (such as found in salmon or tuna) when possible.  °Other °Bouillon, broth, or cream soups made   from allowed foods.  Any strained soup. Casseroles or mixed dishes made with allowed foods. °The items listed above may not be a complete list of recommended foods or beverages. Contact your dietitian for more options.  °WHAT FOODS ARE NOT RECOMMENDED? °Grains °All whole wheat and whole grain breads and crackers. Multigrains, rye, bran seeds, nuts, or coconut. Cereals containing whole grains, multigrains, bran, coconut, nuts, raisins. Cooked or dry oatmeal, steel-cut oats. Coarse wheat cereals, granola. Cereals advertised as high fiber. Potato skins. Whole grain pasta, wild or brown rice. Popcorn. Coconut flour. Bran, buckwheat, corn bread, multigrains, rye, wheat germ.  °Vegetables °Fresh, cooked or canned vegetables, such as artichokes, asparagus, beet greens, broccoli, Brussels sprouts, cabbage, celery, cauliflower, corn, eggplant, kale, legumes or beans, okra, peas, and tomatoes. Avoid large servings of any vegetables, especially raw vegetables.  °Fruits °Fresh fruits, such as apples with or without skin, berries, cherries, figs, grapes, grapefruit, guavas, kiwis, mangoes, oranges, papayas, pears, persimmons, pineapple, and pomegranate. Prune juice and juices with pulp, stewed or dried prunes. Dried fruits, dates, raisins. Fruit seeds or skins. Avoid large servings of all fresh fruits. °Meats and Other Protein Sources °Tough, fibrous meats with gristle. Chunky nut butter. Cheese made with seeds, nuts, or other foods not recommended. Nuts, seeds, legumes (beans, including baked beans), dried peas, beans, lentils.  °Dairy °Yogurt or cheese that contains nuts, seeds, or added fruit.  °Beverages °Fruit juices with high pulp, prune juice. Caffeinated coffee and teas.  °Condiments °Coconut, maple syrup, pickles, olives. °Sweets and Desserts °Desserts, cookies, or candies that contain nuts or coconut, chunky peanut butter, dried fruits. Jams, preserves with seeds, marmalade. Large amounts of sugar and sweets. Any other dessert made with  fruits from the not recommended list.  °Other °Soups made from vegetables that are not recommended or that contain other foods not recommended.  °The items listed above may not be a complete list of foods and beverages to avoid. Contact your dietitian for more information. °  °This information is not intended to replace advice given to you by your health care provider. Make sure you discuss any questions you have with your health care provider. °  °Document Released: 05/28/2002 Document Revised: 12/11/2013 Document Reviewed: 10/29/2013 °Elsevier Interactive Patient Education ©2016 [employer] ° ° ° °Small Bowel Obstruction °A small bowel obstruction means that something is blocking the small bowel. The small bowel is also called the small intestine. It is the long tube that connects the stomach to the colon. An obstruction will stop food and fluids from passing through the small bowel. Treatment depends on what is causing the problem and how bad the problem is. °HOME CARE °· Get a lot of rest. °· Follow your diet as told by your doctor. You may need to: °¨ Only drink clear liquids until you start to get better. °¨ Avoid solid foods as told by your doctor. °· Take over-the-counter and prescription medicines only as told by your doctor. °· Keep all follow-up visits as told by your doctor. This is important. °GET HELP IF: °· You have a fever. °· You have chills. °GET HELP RIGHT AWAY IF: °· You have pain or cramps that get worse. °· You throw up (vomit) blood. °· You have a feeling of being sick to your stomach (nausea) that does not go away. °· You cannot stop throwing up. °· You cannot drink fluids. °· You feel confused. °· You feel dry or thirsty (dehydrated). °· Your belly gets more bloated. °· You feel weak   or you pass out (faint). °  °This information is not intended to replace advice given to you by your health care provider. Make sure you discuss any questions you have with your health care provider. °    °Document Released: 01/13/2005 Document Revised: 08/27/2015 Document Reviewed: 01/30/2015 °Elsevier Interactive Patient Education ©2016 [employer]vier Inc. ° °

## 2016-10-04 NOTE — Progress Notes (Signed)
[facility] Progress Note     Subjective: Sitting up in bed - eager to go home. NGT removed this AM. + flatus and multiple loose BMs. Denies nausea/vomiting. ambulating. Objective: Vital signs in last 24 hours: Temp:  [98 F (36.7 C)-98.6 F (37 C)] 98.6 F (37 C) (10/16 0555) Pulse Rate:  [73-82] 82 (10/16 0555) Resp:  [15-18] 15 (10/16 0555) BP: (111-125)/(75-85) 122/85 (10/16 0555) SpO2:  [97 %-100 %] 100 % (10/16 0555) Last BM Date: 10/02/16  Intake/Output from previous day: 10/15 0701 - 10/16 0700 In: 2436.7 [P.O.:180; I.V.:1656.7; NG/GT:200; IV Piggyback:400] Out: 2250 [Urine:1300; Emesis/NG output:950] Intake/Output this shift: Total I/O In: 800 [I.V.:800] Out: 750 [Urine:600; Emesis/NG output:150]  PE: Gen:  Alert, NAD, pleasant Abd: Soft, NT/ND, +BS, scar from previous ileostomy  Lab Results:   Recent Labs  10/02/16 1720 10/03/16 0411  WBC 15.7*  --   HGB 16.3* 14.3  HCT 48.3*  --   PLT 268  --    BMET  Recent Labs  10/02/16 1720 10/03/16 0411  NA 136  --   K 3.9 3.6  CL 101  --   CO2 25  --   GLUCOSE 125*  --   BUN 9  --   CREATININE 0.68 0.64  CALCIUM 10.1  --    CMP     Component Value Date/Time   NA 136 10/02/2016 1720   K 3.6 10/03/2016 0411   CL 101 10/02/2016 1720   CO2 25 10/02/2016 1720   GLUCOSE 125 (H) 10/02/2016 1720   BUN 9 10/02/2016 1720   CREATININE 0.64 10/03/2016 0411   CALCIUM 10.1 10/02/2016 1720   PROT 9.0 (H) 10/02/2016 1720   ALBUMIN 5.2 (H) 10/02/2016 1720   AST 23 10/02/2016 1720   ALT 16 10/02/2016 1720   ALKPHOS 63 10/02/2016 1720   BILITOT 1.2 10/02/2016 1720   GFRNONAA >60 10/03/2016 0411   GFRAA >60 10/03/2016 0411   Lipase     Component Value Date/Time   LIPASE 38 10/02/2016 1720   Studies/Results: Dg Abd 1 View  Result Date: 10/04/2016 CLINICAL DATA:  Recent small bowel obstruction EXAM: ABDOMEN - 1 VIEW COMPARISON:  Abdominal radiograph October 03, 2016; CT abdomen and pelvis  August 02, 2016 FINDINGS: Contrast is seen in the distal sigmoid colon and rectum. Postoperative changes noted in the rectal region. There is a nasogastric tube with tip and side port in stomach. There is a single loop of mildly dilated small bowel. No other bowel dilatation seen. No free air evident. IMPRESSION: Findings better felt to be indicative of resolving bowel obstruction. A single loop of proximal small bowel remains mildly dilated. Contrast is seen in the distal sigmoid colon and rectum. Nasogastric tube tip and side port in stomach. No free air evident. Electronically Signed   By: Bretta Bang III M.D.   On: 10/04/2016 07:22   Ct Abdomen Pelvis W Contrast  Result Date: 10/02/2016 CLINICAL DATA:  Abdominal pain and vomiting. Status post colectomy. History of ulcerative colitis. EXAM: CT ABDOMEN AND PELVIS WITH CONTRAST TECHNIQUE: Multidetector CT imaging of the abdomen and pelvis was performed using the standard protocol following bolus administration of intravenous contrast. CONTRAST:  30mL ISOVUE-300 IOPAMIDOL (ISOVUE-300) INJECTION 61%, ISOVUE-300 IOPAMIDOL (ISOVUE-300) INJECTION 61% COMPARISON:  None. FINDINGS: Lower chest:  No contributory findings. Hepatobiliary: No focal liver abnormality.No evidence of biliary obstruction or stone. Pancreas: Unremarkable. Spleen: Unremarkable. Adrenals/Urinary Tract: Negative adrenals. Tiny bilateral renal cysts. No hydronephrosis or urolithiasis. Unremarkable bladder.  Stomach/Bowel: Small bowel gradually dilates and oral contrast gradually dilutes. There are 2 possible transition points in the right lower quadrant, most likely 2:69, less likely 2:54. Beyond these narrowings there is relative decompression of small bowel, although fluid is still present within the distal loops and ileal pouch. Small bowel loops in the right lower quadrant occasionally appears thickened, and there is prominence of ileal mesenteric nodes. There is nonspecific  mesenteric congestion and trace peritoneal fluid, greatest in the right abdomen. No evidence of perforation or abscess. Vascular/Lymphatic: No acute vascular abnormality. Reproductive:Ovoid fluid collections of the bilateral adnexae could be paraovarian cyst or peritoneal inclusion cyst. Other: No ascites or pneumoperitoneum. Musculoskeletal: No acute or aggressive finding.  No sacroiliitis. IMPRESSION: 1. Partial distal small bowel obstruction with transition in the right lower quadrant. Some bowel loops appear thickened in the right lower quadrant, with mild mesenteric adenopathy, possible underlying enteritis. 2. Total colectomy with ileal pouch. Electronically Signed   By: Marnee SpringJonathon  Watts M.D.   On: 10/02/2016 21:37   Dg Abd Portable 1v-small Bowel Obstruction Protocol-initial, 8 Hr Delay  Result Date: 10/03/2016 CLINICAL DATA:  Small bowel obstruction. Eight hour delay after oral contrast administration. EXAM: PORTABLE ABDOMEN - 1 VIEW COMPARISON:  10/03/2016, earlier the same day FINDINGS: Tip of the NG tube overlies the lateral stomach. Gaseous small bowel distension is noted with contrast visualized in the distended central small bowel loops. No definite colonic contrast at this time. Bladder contrast consistent with recent CT scan. IMPRESSION: Oral contrast material migration into the distended central small bowel loops. Electronically Signed   By: Kennith CenterEric  Mansell M.D.   On: 10/03/2016 10:10   Dg Abd Portable 1v-small Bowel Protocol-position Verification  Result Date: 10/03/2016 CLINICAL DATA:  NG tube placement. EXAM: PORTABLE ABDOMEN - 1 VIEW COMPARISON:  CT abdomen/pelvis yesterday FINDINGS: Tip and side port of the enteric tube below the diaphragm in the stomach. Small bowel dilatation was better appreciated on CT. There is excreted intravenous contrast within the renal collecting systems. IMPRESSION: Tip and side port of the enteric tube below the diaphragm in the stomach. Electronically Signed    By: Rubye OaksMelanie  Ehinger M.D.   On: 10/03/2016 01:11    Anti-infectives: Anti-infectives    None     Assessment/Plan SBO - repeat films show contrast moving all the way through the small bowel  - return of bowel function and resolved nausea/vomiting  - patient adamant about going home today; discussed starting clears and gradually advancing to full liquids/soft bland diet. Discussed signed and symptoms of obstruction. Patient agreeable to advance her own diet at home judiciously and come back to ED/call our office if signs of small bowel obstruction recur.  Ulcerative colitis  - urgent colectomy 2015, takedown Dec 2015 - [facility]; to see again January 2018  Plan: resolving SBO - discharge patient if tolerates liquid diet.    LOS: 2 days    Adam PhenixElizabeth S Davelle Anselmi , [facility] 10/04/2016, 1:05 PM Pager: [phone] Consults: [phone] Mon-Fri 7:00 am-4:30 pm Sat-Sun 7:00 am-11:30 am

## 2016-10-07 NOTE — Discharge Summary (Signed)
[facility] Discharge Summary   Patient ID: Morgan DeltonSalam [name] MRN: [id number] DOB/AGE: 41-Sep-1977 41 y.o.  Admit date: 10/02/2016 Discharge date: 10/07/2016  Admitting Diagnosis: SBO  Discharge Diagnosis Patient Active Problem List   Diagnosis Date Noted  . Ulcerative colitis s/p colectomy & J pouch 2015 10/02/2016  . SBO (small bowel obstruction) 10/02/2016  . 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 Anemia of chronic disease 12/03/2013  . Ulcerative colitis with rectal bleeding s/p colectomy 2015 12/03/2013  . Headache(784.0) 10/01/2012    Consultants None  Imaging: 10/02/16 CT ABD/PELV W/ CONTRAST: Partial distal small bowel obstruction with transition in the right lower quadrant. S/p total colectomy with ileal pouch.  10/03/16 DG Abd Portable: NG tube in appropriate position  10/03/16 DG Abd Portable: small bowel distention contrast material in central small bowel  10/04/16 DG Abd:  Improvement in small bowel dilatation  Procedures None   Hospital Course:  40 y/o female with PMH ulcerative colitis [facility] discomfort, nausea, and vomiting .  CT scan and physical exam consistent with small bowel obstruction and the small bowel protocol was initiated. On hospital day #2, the patient was having bowel movements, voiding well, tolerating diet, ambulating well, pain well controlled, vital signs stable, and felt stable for discharge home.       Medication List    TAKE these medications   bisacodyl 5 MG EC tablet Commonly known as:  DULCOLAX Take 5 mg by mouth daily as needed for moderate constipation.   zolpidem 10 MG tablet Commonly known as:  AMBIEN Take 10 mg by mouth at bedtime as needed for sleep.       Follow-up Information    [facility] [place] [facility]   Specialty:  General Surgery Why:  follow up as needed  Contact information: [address] [place] [id number] [phone]          Signed: Hosie SpangleElizabeth Samrat Hayward, [facility] 10/07/2016, 2:28 PM Pager: [phone] Consults: [phone] Mon-Fri 7:00 am-4:30 pm Sat-Sun 7:00 am-11:30 am

## 2018-01-24 DIAGNOSIS — Z8719 Personal history of other diseases of the digestive system: Secondary | ICD-10-CM | POA: Insufficient documentation

## 2018-04-29 IMAGING — CT CT ABD-PELV W/ CM
2 of 5 series · 16 of 46 positions shown, 18 images · IV contrast (ISOVUE)
Comparison: None.

CLINICAL DATA: Abdominal pain and vomiting. Status post colectomy.
History of ulcerative colitis.

EXAM:
CT ABDOMEN AND PELVIS WITH CONTRAST
TECHNIQUE: Multidetector CT imaging of the abdomen and pelvis was performed
using the standard protocol following bolus administration of
intravenous contrast.
CONTRAST:  30mL 0AD4QM-CCC IOPAMIDOL (0AD4QM-CCC) INJECTION 61%,
100mL 0AD4QM-CCC IOPAMIDOL (0AD4QM-CCC) INJECTION 61%

[Series 2: abd/pel with · axial · 0.79mm/px · z∈[-541,-141]mm · 13 of 94 slices shown, 15 images]
[im 7/94  soft-tissue]
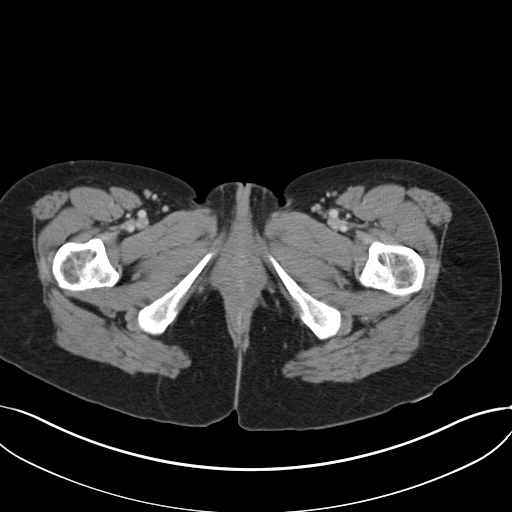
[im 7/94  bone]
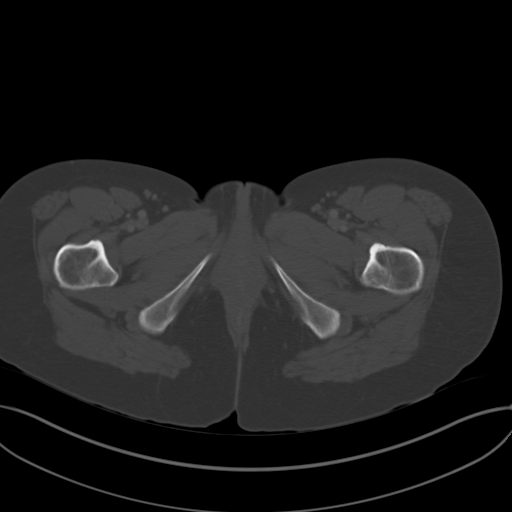
[im 13/94  soft-tissue]
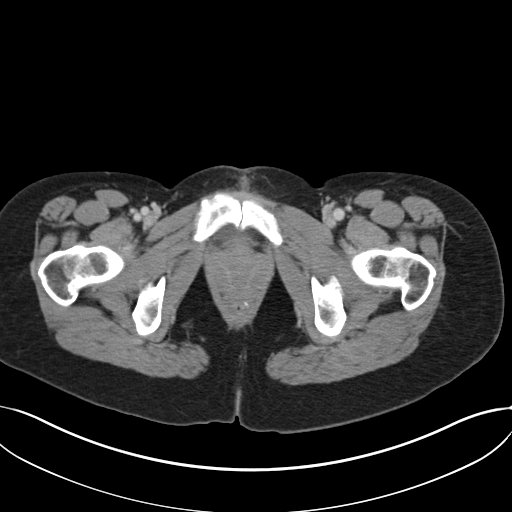
[im 19/94  soft-tissue]
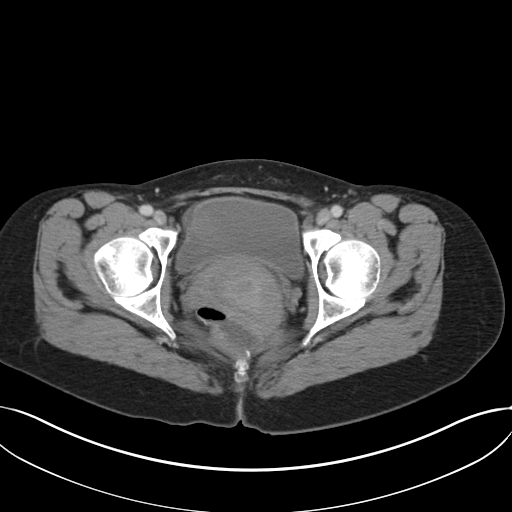
[im 25/94  soft-tissue]
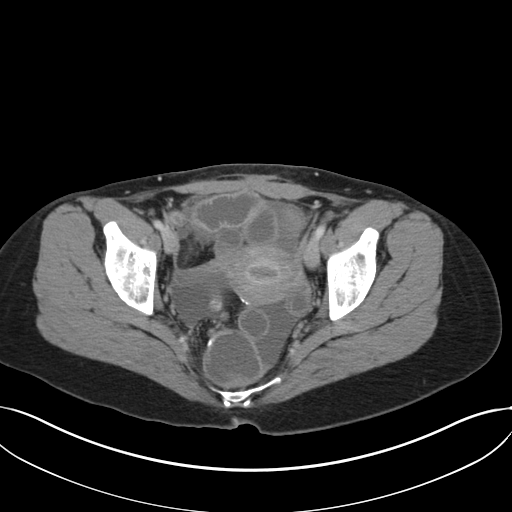
[im 32/94  soft-tissue]
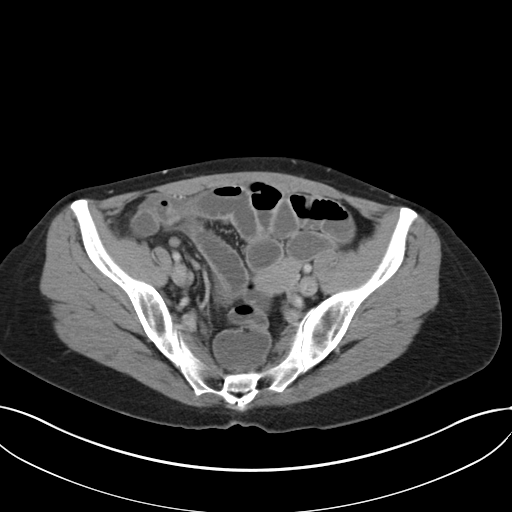
[im 38/94  soft-tissue]
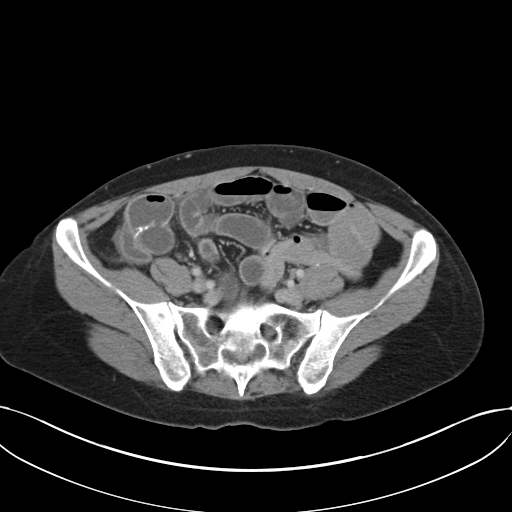
[im 50/94  soft-tissue]
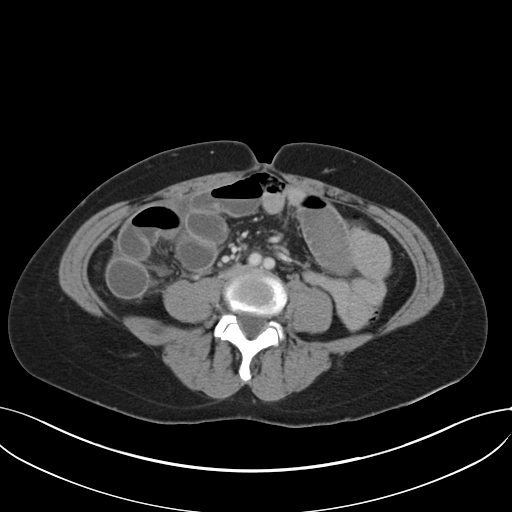
[im 56/94  soft-tissue]
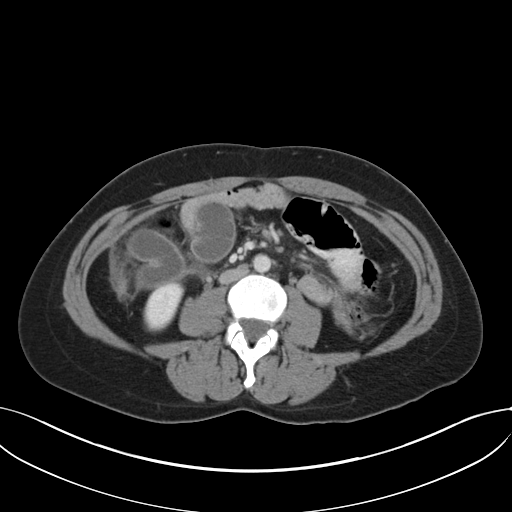
[im 63/94  soft-tissue]
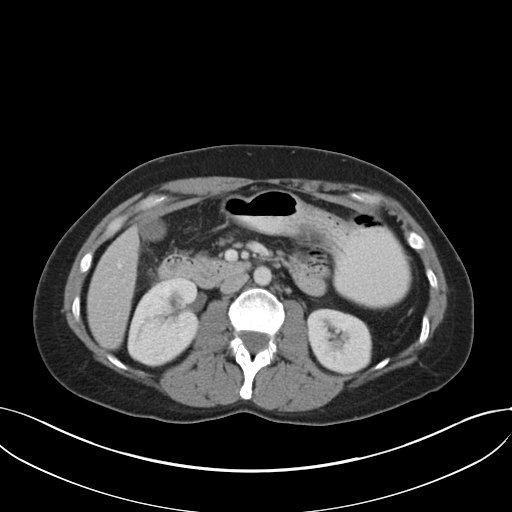
[im 63/94  bone]
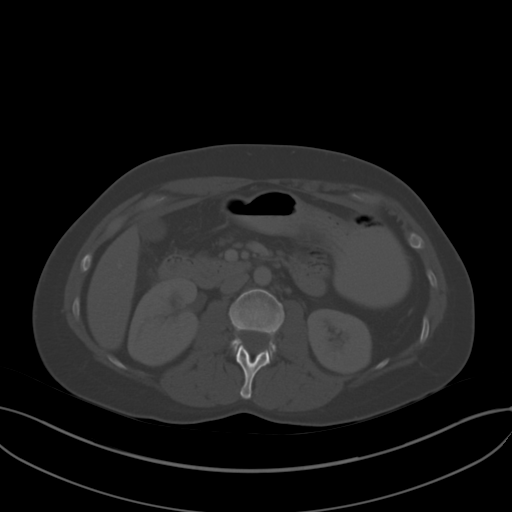
[im 69/94  soft-tissue]
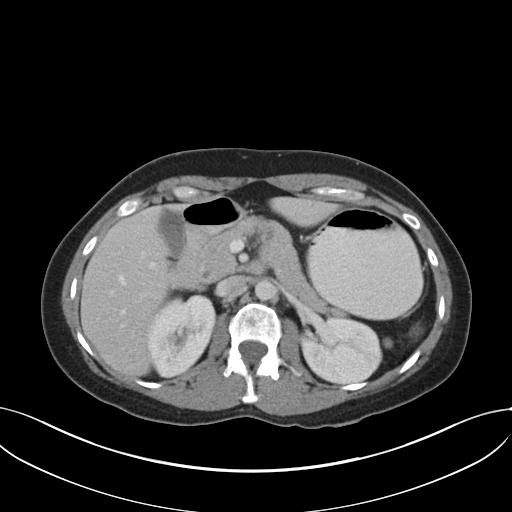
[im 75/94  soft-tissue]
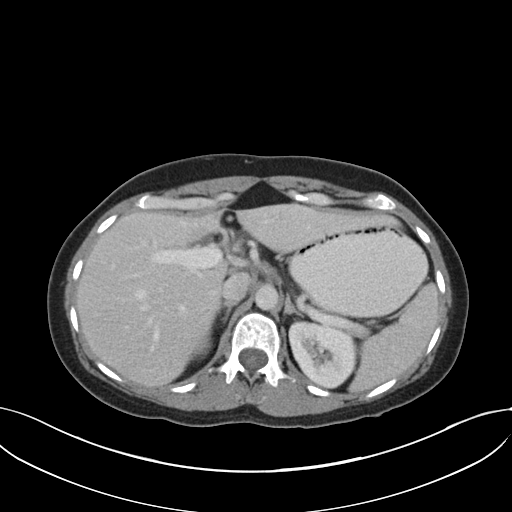
[im 81/94  soft-tissue]
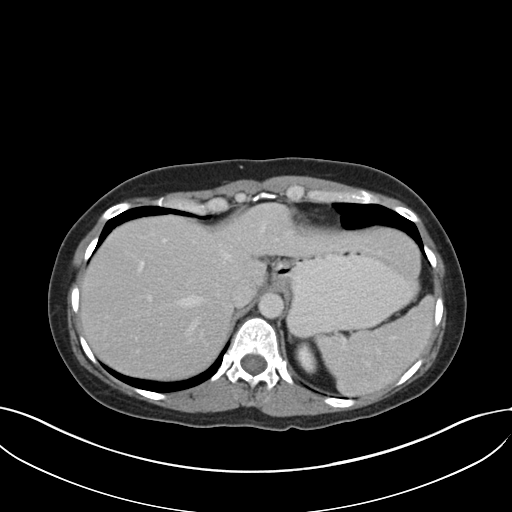
[im 87/94  soft-tissue]
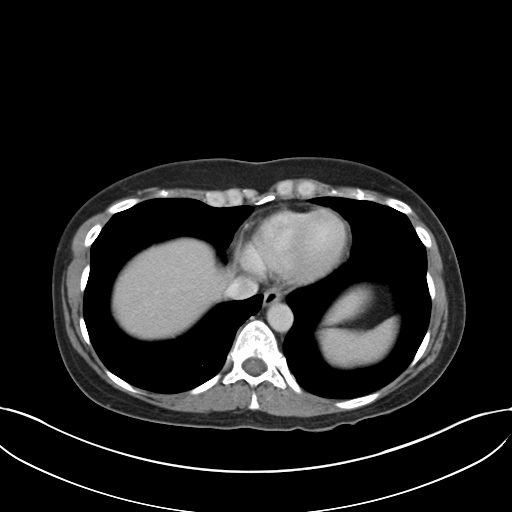

[Series 4: coronal a/|p · coronal · 0.74mm/px · 3 of 125 slices shown]
[im 42/125  soft-tissue]
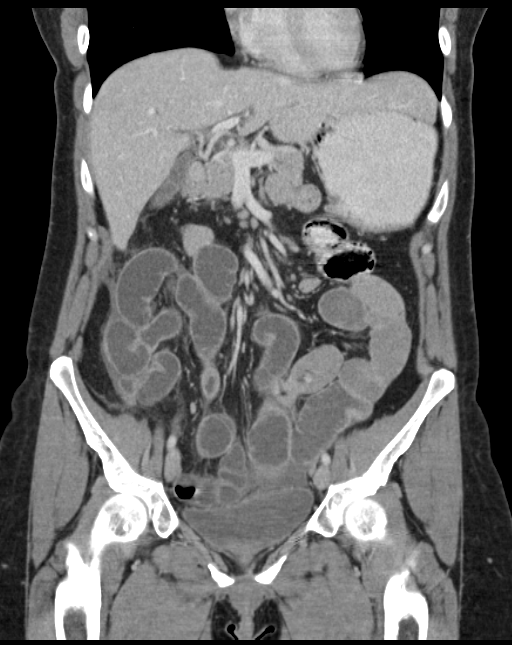
[im 56/125  soft-tissue]
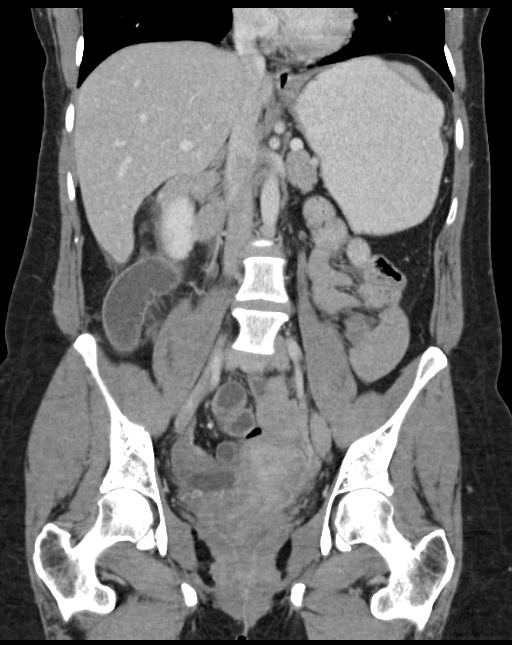
[im 69/125  soft-tissue]
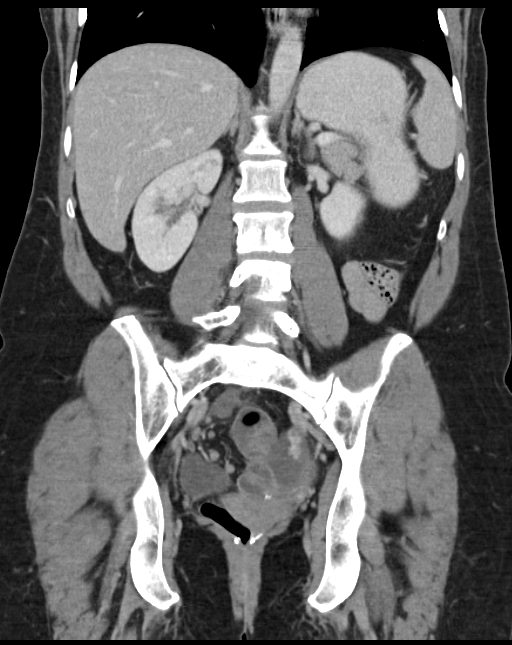

[16 of 46 positions shown; findings below may reference images not displayed]

FINDINGS: Lower chest:  No contributory findings.

Hepatobiliary: No focal liver abnormality.No evidence of biliary
obstruction or stone.

Pancreas: Unremarkable.

Spleen: Unremarkable.

Adrenals/Urinary Tract: Negative adrenals. Tiny bilateral renal
cysts. No hydronephrosis or urolithiasis. Unremarkable bladder.

Stomach/Bowel: Small bowel gradually dilates and oral contrast
gradually dilutes. There are 2 possible transition points in the
right lower quadrant, most likely [DATE], less likely [DATE]. Beyond
these narrowings there is relative decompression of small bowel,
although fluid is still present within the distal loops and ileal
pouch. Small bowel loops in the right lower quadrant occasionally
appears thickened, and there is prominence of ileal mesenteric
nodes. There is nonspecific mesenteric congestion and trace
peritoneal fluid, greatest in the right abdomen. No evidence of
perforation or abscess.

Vascular/Lymphatic: No acute vascular abnormality.

Reproductive:Ovoid fluid collections of the bilateral adnexae could
be paraovarian cyst or peritoneal inclusion cyst.

Other: No ascites or pneumoperitoneum.

Musculoskeletal: No acute or aggressive finding.  No sacroiliitis.
IMPRESSION: 1. Partial distal small bowel obstruction with transition in the
right lower quadrant. Some bowel loops appear thickened in the right
lower quadrant, with mild mesenteric adenopathy, possible underlying
enteritis.
2. Total colectomy with ileal pouch.

## 2018-05-01 IMAGING — DX DG ABDOMEN 1V
2 series · 2 of 2 positions shown · non-contrast
Comparison: Abdominal radiograph October 03, 2016; CT abdomen and
pelvis August 02, 2016

CLINICAL DATA: Recent small bowel obstruction

EXAM:
ABDOMEN - 1 VIEW

[abdomen kub (1 of 2)]
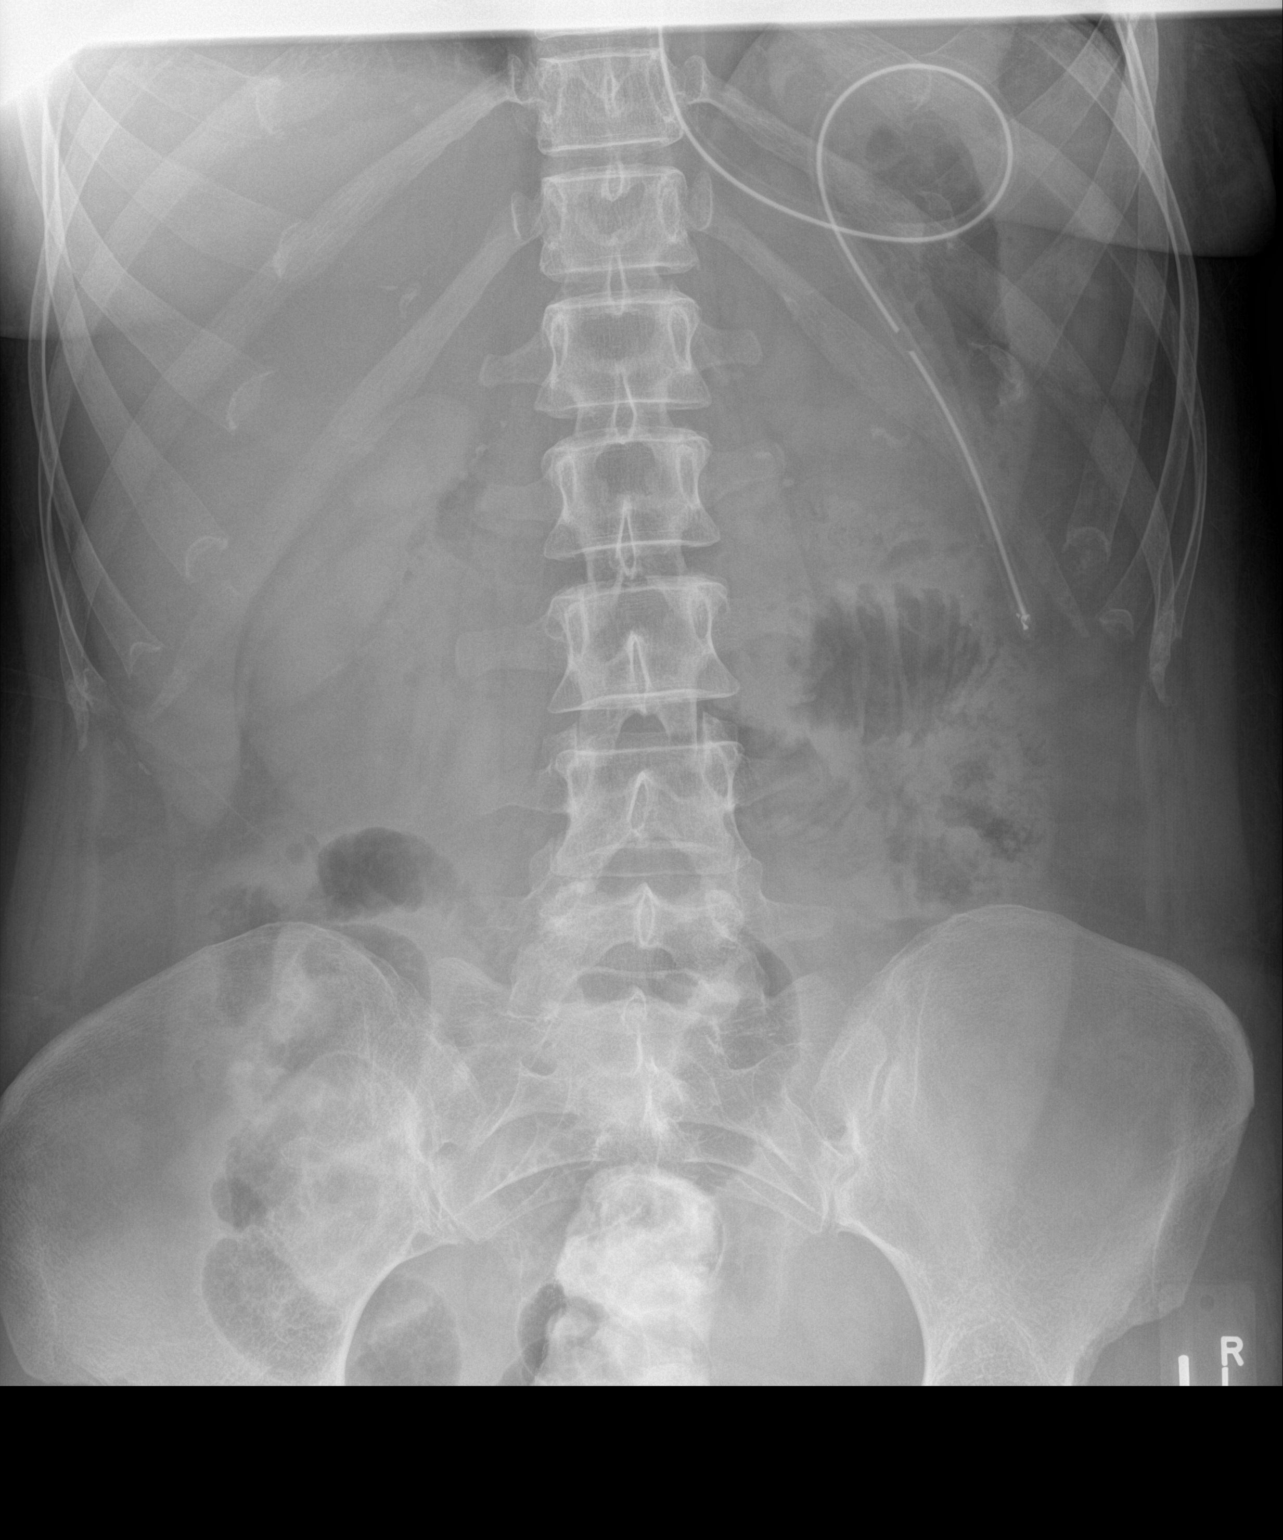

[abdomen kub (2 of 2)]
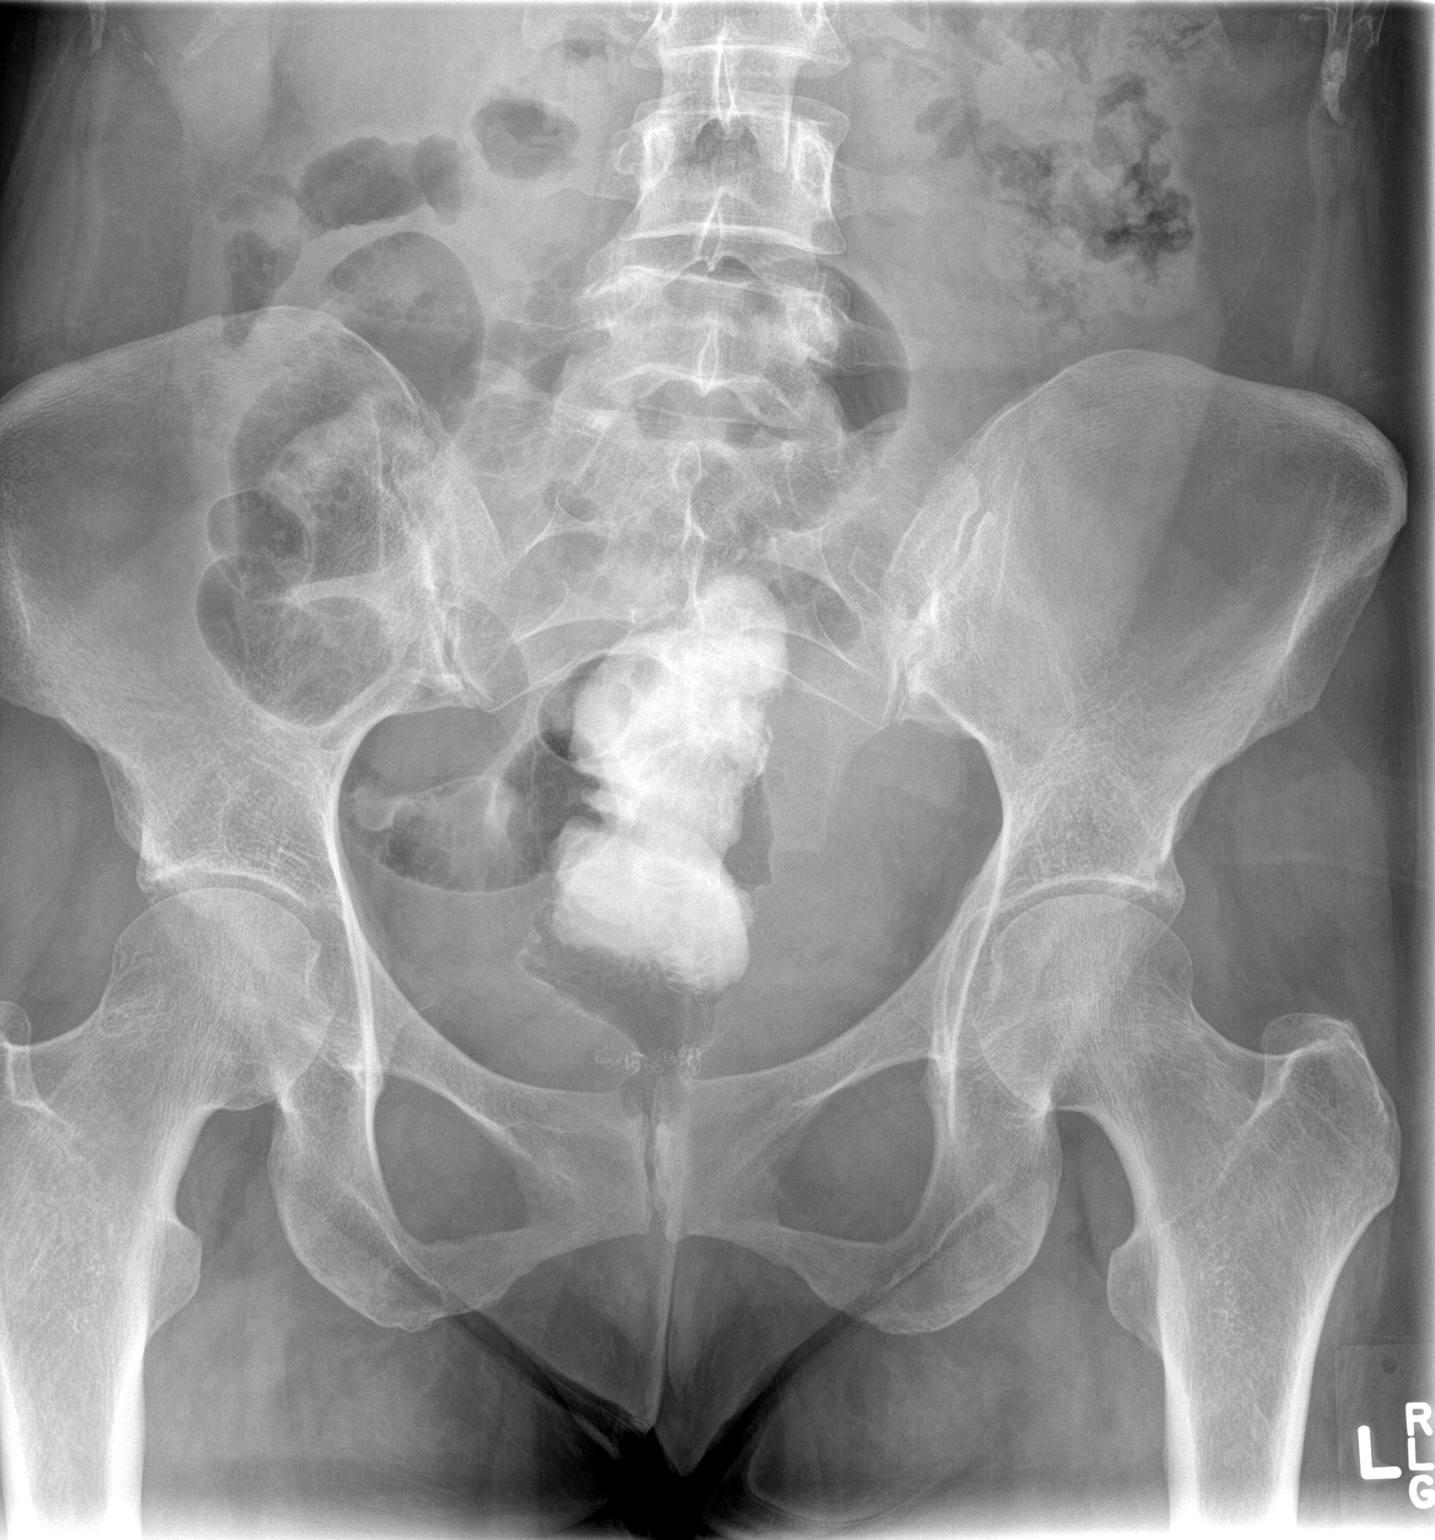

[2 of 2 positions shown; findings below may reference images not displayed]

FINDINGS: Contrast is seen in the distal sigmoid colon and rectum.
Postoperative changes noted in the rectal region.

There is a nasogastric tube with tip and side port in stomach. There
is a single loop of mildly dilated small bowel. No other bowel
dilatation seen. No free air evident.
IMPRESSION: Findings better felt to be indicative of resolving bowel
obstruction. A single loop of proximal small bowel remains mildly
dilated. Contrast is seen in the distal sigmoid colon and rectum.
Nasogastric tube tip and side port in stomach. No free air evident.

## 2020-09-02 ENCOUNTER — Other Ambulatory Visit: Payer: Self-pay

## 2020-09-02 ENCOUNTER — Ambulatory Visit (INDEPENDENT_AMBULATORY_CARE_PROVIDER_SITE_OTHER): Payer: Managed Care, Other (non HMO)

## 2020-09-02 ENCOUNTER — Ambulatory Visit (INDEPENDENT_AMBULATORY_CARE_PROVIDER_SITE_OTHER): Payer: Managed Care, Other (non HMO) | Admitting: Podiatry

## 2020-09-02 ENCOUNTER — Encounter: Payer: Self-pay | Admitting: Podiatry

## 2020-09-02 DIAGNOSIS — M21612 Bunion of left foot: Secondary | ICD-10-CM

## 2020-09-02 DIAGNOSIS — M79672 Pain in left foot: Secondary | ICD-10-CM

## 2020-09-02 MED ORDER — DICLOFENAC SODIUM 1 % EX GEL: 2.0000 g | Freq: Four times a day (QID) | CUTANEOUS | 2 refills | Status: AC

## 2020-09-02 NOTE — Patient Instructions (Signed)
Bunion  A bunion is a bump on the base of the big toe that forms when the bones of the big toe joint move out of position. Bunions may be small at first, but they often get larger over time. They can make walking painful. What are the causes? A bunion may be caused by:  Wearing narrow or pointed shoes that force the big toe to press against the other toes.  Abnormal foot development that causes the foot to roll inward (pronate).  Changes in the foot that are caused by certain diseases, such as rheumatoid arthritis or polio.  A foot injury. What increases the risk? The following factors may make you more likely to develop this condition:  Wearing shoes that squeeze the toes together.  Having certain diseases, such as: ? Rheumatoid arthritis. ? Polio. ? Cerebral palsy.  Having family members who have bunions.  Being born with a foot deformity, such as flat feet or low arches.  Doing activities that put a lot of pressure on the feet, such as ballet dancing. What are the signs or symptoms? The main symptom of a bunion is a noticeable bump on the big toe. Other symptoms may include:  Pain.  Swelling around the big toe.  Redness and inflammation.  Thick or hardened skin on the big toe or between the toes.  Stiffness or loss of motion in the big toe.  Trouble with walking. How is this diagnosed? A bunion may be diagnosed based on your symptoms, medical history, and activities. You may have tests, such as:  X-rays. These allow your health care provider to check the position of the bones in your foot and look for damage to your joint. They also help your health care provider determine the severity of your bunion and the best way to treat it.  Joint aspiration. In this test, a sample of fluid is removed from the toe joint. This test may be done if you are in a lot of pain. It helps rule out diseases that cause painful swelling of the joints, such as arthritis. How is this  treated? Treatment depends on the severity of your symptoms. The goal of treatment is to relieve symptoms and prevent the bunion from getting worse. Your health care provider may recommend:  Wearing shoes that have a wide toe box.  Using bunion pads to cushion the affected area.  Taping your toes together to keep them in a normal position.  Placing a device inside your shoe (orthotics) to help reduce pressure on your toe joint.  Taking medicine to ease pain, inflammation, and swelling.  Applying heat or ice to the affected area.  Doing stretching exercises.  Surgery to remove scar tissue and move the toes back into their normal position. This treatment is rare. Follow these instructions at home: Managing pain, stiffness, and swelling   If directed, put ice on the painful area: ? Put ice in a plastic bag. ? Place a towel between your skin and the bag. ? Leave the ice on for 20 minutes, 2-3 times a day. Activity   If directed, apply heat to the affected area before you exercise. Use the heat source that your health care provider recommends, such as a moist heat pack or a heating pad. ? Place a towel between your skin and the heat source. ? Leave the heat on for 20-30 minutes. ? Remove the heat if your skin turns bright red. This is especially important if you are unable to feel pain,   heat, or cold. You may have a greater risk of getting burned.  Do exercises as told by your health care provider. General instructions  Support your toe joint with proper footwear, shoe padding, or taping as told by your health care provider.  Take over-the-counter and prescription medicines only as told by your health care provider.  Keep all follow-up visits as told by your health care provider. This is important. Contact a health care provider if your symptoms:  Get worse.  Do not improve in 2 weeks. Get help right away if you have:  Severe pain and trouble with walking. Summary  A  bunion is a bump on the base of the big toe that forms when the bones of the big toe joint move out of position.  Bunions can make walking painful.  Treatment depends on the severity of your symptoms.  Support your toe joint with proper footwear, shoe padding, or taping as told by your health care provider. This information is not intended to replace advice given to you by your health care provider. Make sure you discuss any questions you have with your health care provider. Document Revised: 06/12/2018 Document Reviewed: 04/18/2018 Elsevier Patient Education  2020 [employer]  

## 2020-09-08 DIAGNOSIS — M21612 Bunion of left foot: Secondary | ICD-10-CM | POA: Insufficient documentation

## 2020-09-08 NOTE — Progress Notes (Signed)
Subjective:   Patient ID: Morgan [name], female   DOB: 44 y.o.   MRN: [id number]   HPI 44 year old female presents the office today for concerns of a painful bunion on her left foot.  She states this started a year ago with a gradual getting worse.  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  She denies any recent injury or trauma to her feet.  No other treatment at this time.  No other concerns today.   Review of Systems  All other systems reviewed and are negative.  Past Medical History:  Diagnosis Date   Anemia of chronic disease 12/03/2013   H/O Clostridium difficile infection 12/03/2013   History of blood transfusion    Ulcerative colitis s/p colectomy & J pouch 2015 10/02/2016   [facility] Dr Byrd Hesselbach   Ulcerative colitis with rectal bleeding s/p colectomy 2015 12/03/2013    Past Surgical History:  Procedure Laterality Date   COLOPROCTECTOMY W/ ILEO J POUCH  08/15/2014   Dr Byrd Hesselbach [facility]: medical refractory ulcerative colitis s/p total proctocolectomy with ileal J pouch    ILEOSTOMY CLOSURE  11/23/2014   Dr Byrd Hesselbach, [facility]   redo epistomy     SUBTOTAL COLECTOMY  01/2014   Dr Byrd Hesselbach, [facility]   thyro glossal duct cyst  2010   WISDOM TOOTH EXTRACTION  1998     Current Outpatient Medications:    escitalopram (LEXAPRO) 20 MG tablet, Take 1 tablet by mouth daily., Disp: , Rfl:    Iron Polysacch Cmplx-B12-FA (POLY-IRON 150 FORTE) 150-0.025-1 MG CAPS, Take by mouth., Disp: , Rfl:    nystatin-triamcinolone (MYCOLOG II) cream, Apply topically., Disp: , Rfl:    zaleplon (SONATA) 10 MG capsule, Take by mouth., Disp: , Rfl:    bisacodyl (DULCOLAX) 5 MG EC tablet, Take 5 mg by mouth daily as needed for moderate constipation., Disp: , Rfl:    CAMRESE LO 0.1-0.02 & 0.01 MG tablet, Take 1 tablet by mouth daily., Disp: , Rfl:    desvenlafaxine (PRISTIQ) 50 MG 24 hr tablet, Take 50 mg by mouth every morning., Disp: , Rfl:     diclofenac Sodium (VOLTAREN) 1 % GEL, Apply 2 g topically 4 (four) times daily. Rub into affected area of foot 2 to 4 times daily, Disp: 100 g, Rfl: 2   ibuprofen (ADVIL) 800 MG tablet, Take 800 mg by mouth 3 (three) times daily., Disp: , Rfl:    zolpidem (AMBIEN) 10 MG tablet, Take 10 mg by mouth at bedtime as needed for sleep. , Disp: , Rfl:   Allergies  Allergen Reactions   Budesonide Other (See Comments)    Brain swelling         Objective:  Physical Exam  General: AAO x3, NAD  Dermatological: Skin is warm, dry and supple bilateral. There are no open sores, no preulcerative lesions, no rash or signs of infection present.  Vascular: Dorsalis Pedis artery and Posterior Tibial artery pedal pulses are 2/4 bilateral with immedate capillary fill time. There is no pain with calf compression, swelling, warmth, erythema.   Neruologic: Grossly intact via light touch bilateral.  Sensation intact with Semmes Weinstein monofilament  Musculoskeletal: Moderate bunion deformities present on the left foot.  There is no pain crepitation present degenerative motion.  No significant peripheral hypermobility is present.  Subjective patient is tenderness on medial aspect the first metatarsal head on the bunion site.  Muscular strength 5/5 in all groups tested bilateral.  Gait: Unassisted, Nonantalgic.       Assessment:   44 year old female with symptomatic bunion     Plan:  -Treatment options discussed including all alternatives, risks, and complications -Etiology of symptoms were discussed -X-rays were obtained and reviewed with the patient.  Moderate bunion deformities present.  There is no evidence of acute fracture. -We discussed both conservative as well as surgical treatment options.  At this time she can continue with conservative treatment we dispensed offloading pads we discussed shoe modifications.  She can use Voltaren gel as needed as well.  We discussed first metatarsal tenderness  or fixation.  Vivi Barrack DPM

## 2022-01-07 ENCOUNTER — Other Ambulatory Visit: Payer: Self-pay | Admitting: Obstetrics and Gynecology

## 2022-01-07 DIAGNOSIS — Z8249 Family history of ischemic heart disease and other diseases of the circulatory system: Secondary | ICD-10-CM

## 2022-02-12 ENCOUNTER — Other Ambulatory Visit: Payer: Commercial Indemnity

## 2022-02-26 ENCOUNTER — Ambulatory Visit
Admission: RE | Admit: 2022-02-26 | Discharge: 2022-02-26 | Disposition: A | Discharge status: Home / Self Care | Payer: No Typology Code available for payment source | Source: Ambulatory Visit | Attending: Obstetrics and Gynecology | Admitting: Obstetrics and Gynecology

## 2022-02-26 DIAGNOSIS — Z8249 Family history of ischemic heart disease and other diseases of the circulatory system: Secondary | ICD-10-CM

## 2023-02-17 ENCOUNTER — Other Ambulatory Visit: Payer: Self-pay | Admitting: Endocrinology

## 2023-02-17 DIAGNOSIS — E042 Nontoxic multinodular goiter: Secondary | ICD-10-CM
